# Patient Record
Sex: Male | Born: 1990
Health system: Southern US, Community
[De-identification: ages and names within clinical notes are randomized; demographics above are authoritative.]

## PROBLEM LIST (undated history)

## (undated) DIAGNOSIS — J189 Pneumonia, unspecified organism: Secondary | ICD-10-CM

## (undated) DIAGNOSIS — R011 Cardiac murmur, unspecified: Secondary | ICD-10-CM

## (undated) DIAGNOSIS — K219 Gastro-esophageal reflux disease without esophagitis: Secondary | ICD-10-CM

## (undated) DIAGNOSIS — J40 Bronchitis, not specified as acute or chronic: Secondary | ICD-10-CM

---
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
## 2011-01-31 ENCOUNTER — Inpatient Hospital Stay (INDEPENDENT_AMBULATORY_CARE_PROVIDER_SITE_OTHER)
Admission: RE | Admit: 2011-01-31 | Discharge: 2011-01-31 | Disposition: A | Discharge status: Home / Self Care | Payer: No Typology Code available for payment source | Source: Ambulatory Visit | Attending: Emergency Medicine | Admitting: Emergency Medicine

## 2011-01-31 DIAGNOSIS — T148XXA Other injury of unspecified body region, initial encounter: Secondary | ICD-10-CM

## 2011-01-31 DIAGNOSIS — S139XXA Sprain of joints and ligaments of unspecified parts of neck, initial encounter: Secondary | ICD-10-CM

## 2011-03-17 ENCOUNTER — Emergency Department (HOSPITAL_BASED_OUTPATIENT_CLINIC_OR_DEPARTMENT_OTHER)
Admission: EM | Admit: 2011-03-17 | Discharge: 2011-03-18 | Disposition: A | Discharge status: Home / Self Care | Payer: Self-pay | Attending: Emergency Medicine | Admitting: Emergency Medicine

## 2011-03-17 ENCOUNTER — Emergency Department (INDEPENDENT_AMBULATORY_CARE_PROVIDER_SITE_OTHER): Payer: Self-pay

## 2011-03-17 DIAGNOSIS — M25519 Pain in unspecified shoulder: Secondary | ICD-10-CM

## 2011-03-17 DIAGNOSIS — Y9383 Activity, rough housing and horseplay: Secondary | ICD-10-CM

## 2011-03-17 DIAGNOSIS — S43006A Unspecified dislocation of unspecified shoulder joint, initial encounter: Secondary | ICD-10-CM | POA: Insufficient documentation

## 2011-03-17 DIAGNOSIS — X500XXA Overexertion from strenuous movement or load, initial encounter: Secondary | ICD-10-CM

## 2011-03-17 DIAGNOSIS — J45909 Unspecified asthma, uncomplicated: Secondary | ICD-10-CM | POA: Insufficient documentation

## 2011-03-17 DIAGNOSIS — Y92009 Unspecified place in unspecified non-institutional (private) residence as the place of occurrence of the external cause: Secondary | ICD-10-CM | POA: Insufficient documentation

## 2011-03-17 DIAGNOSIS — X58XXXA Exposure to other specified factors, initial encounter: Secondary | ICD-10-CM | POA: Insufficient documentation

## 2011-03-20 LAB — GC/CHLAMYDIA PROBE AMP, GENITAL
Chlamydia, DNA Probe: POSITIVE — AB
GC Probe Amp, Genital: POSITIVE — AB

## 2011-04-01 ENCOUNTER — Emergency Department (HOSPITAL_COMMUNITY)
Admission: EM | Admit: 2011-04-01 | Discharge: 2011-04-01 | Disposition: A | Discharge status: Home / Self Care | Payer: Self-pay | Attending: Emergency Medicine | Admitting: Emergency Medicine

## 2011-04-01 DIAGNOSIS — L02219 Cutaneous abscess of trunk, unspecified: Secondary | ICD-10-CM | POA: Insufficient documentation

## 2011-04-04 ENCOUNTER — Emergency Department (HOSPITAL_COMMUNITY)
Admission: EM | Admit: 2011-04-04 | Discharge: 2011-04-04 | Disposition: A | Discharge status: Home / Self Care | Payer: Self-pay | Attending: Emergency Medicine | Admitting: Emergency Medicine

## 2011-04-04 DIAGNOSIS — IMO0002 Reserved for concepts with insufficient information to code with codable children: Secondary | ICD-10-CM | POA: Insufficient documentation

## 2011-07-26 ENCOUNTER — Encounter: Payer: Self-pay | Admitting: *Deleted

## 2011-07-26 ENCOUNTER — Emergency Department (HOSPITAL_BASED_OUTPATIENT_CLINIC_OR_DEPARTMENT_OTHER)
Admission: EM | Admit: 2011-07-26 | Discharge: 2011-07-26 | Disposition: A | Discharge status: Home / Self Care | Payer: Self-pay | Attending: Emergency Medicine | Admitting: Emergency Medicine

## 2011-07-26 DIAGNOSIS — F17200 Nicotine dependence, unspecified, uncomplicated: Secondary | ICD-10-CM | POA: Insufficient documentation

## 2011-07-26 DIAGNOSIS — J45909 Unspecified asthma, uncomplicated: Secondary | ICD-10-CM | POA: Insufficient documentation

## 2011-07-26 DIAGNOSIS — K0889 Other specified disorders of teeth and supporting structures: Secondary | ICD-10-CM

## 2011-07-26 DIAGNOSIS — K089 Disorder of teeth and supporting structures, unspecified: Secondary | ICD-10-CM | POA: Insufficient documentation

## 2011-07-26 MED ORDER — HYDROCODONE-ACETAMINOPHEN 5-325 MG PO TABS: 1.0000 | ORAL_TABLET | ORAL | Status: AC | PRN

## 2011-07-26 MED ORDER — OXYCODONE-ACETAMINOPHEN 5-325 MG PO TABS
1.0000 | ORAL_TABLET | Freq: Once | ORAL | Status: AC
  Administered 2011-07-26: 1 via ORAL

## 2011-07-26 MED ORDER — IBUPROFEN 400 MG PO TABS
600.0000 mg | ORAL_TABLET | Freq: Once | ORAL | Status: AC
  Administered 2011-07-26: 800 mg via ORAL

## 2011-07-26 MED ORDER — CLINDAMYCIN HCL 150 MG PO CAPS: 150.0000 mg | ORAL_CAPSULE | Freq: Four times a day (QID) | ORAL | Status: AC

## 2011-07-26 MED ORDER — IBUPROFEN 600 MG PO TABS: 600.0000 mg | ORAL_TABLET | Freq: Four times a day (QID) | ORAL | Status: AC | PRN

## 2011-07-26 NOTE — ED Notes (Signed)
Pt c/o left jaw pain and toothache x 1 day

## 2011-07-26 NOTE — ED Provider Notes (Signed)
History     CSN: [id number] Arrival date & time: 07/26/2011  4:47 PM  Chief Complaint  Patient presents with  . Dental Pain   Patient is a 20 y.o. male presenting with tooth pain. The history is provided by the patient.  Dental PainThe primary symptoms include mouth pain. Primary symptoms do not include dental injury, oral bleeding, fever or shortness of breath. Episode onset: today. The symptoms are unchanged. The symptoms are new. The symptoms occur constantly.  Additional symptoms include: dental sensitivity to temperature. Additional symptoms do not include: gum swelling, gum tenderness, trismus, facial swelling, pain with swallowing and drooling.    Past Medical History  Diagnosis Date  . Asthma     History reviewed. No pertinent past surgical history.  History reviewed. No pertinent family history.  History  Substance Use Topics  . Smoking status: Current Everyday Smoker -- 0.5 packs/day  . Smokeless tobacco: Not on file  . Alcohol Use: No      Review of Systems  Constitutional: Negative for fever.  HENT: Negative for facial swelling and drooling.   Respiratory: Negative for shortness of breath.   All other systems reviewed and are negative.    Physical Exam  BP 131/79  Temp(Src) 98.5 F (36.9 C) (Oral)  Resp 16  Wt 165 lb (74.844 kg)  Physical Exam  Nursing note and vitals reviewed. Constitutional: He is oriented to person, place, and time. He appears well-developed and well-nourished.  HENT:  Head: Normocephalic.       Dental carrie with tenderness and no gingival swelling of the left lower 2nd molar. Impacted left lower wisdom tooth  Eyes: EOM are normal.  Neck: Normal range of motion.  Pulmonary/Chest: Effort normal.  Musculoskeletal: Normal range of motion.  Neurological: He is alert and oriented to person, place, and time.  Psychiatric: He has a normal mood and affect.    ED Course  Procedures  MDM Dental Pain. Home with antibiotics and pain  medicine. Recommend dental follow up. No signs of gingival abscess. Tolerating secretions. Airway patent. No sub lingular swelling       Lyanne Co, MD 07/26/11 [phone]

## 2012-04-04 ENCOUNTER — Encounter (HOSPITAL_COMMUNITY): Payer: Self-pay | Admitting: *Deleted

## 2012-04-04 ENCOUNTER — Emergency Department (INDEPENDENT_AMBULATORY_CARE_PROVIDER_SITE_OTHER)
Admission: EM | Admit: 2012-04-04 | Discharge: 2012-04-04 | Disposition: A | Discharge status: Home / Self Care | Payer: Self-pay | Source: Home / Self Care | Attending: Emergency Medicine | Admitting: Emergency Medicine

## 2012-04-04 DIAGNOSIS — L739 Follicular disorder, unspecified: Secondary | ICD-10-CM

## 2012-04-04 DIAGNOSIS — L299 Pruritus, unspecified: Secondary | ICD-10-CM

## 2012-04-04 DIAGNOSIS — L738 Other specified follicular disorders: Secondary | ICD-10-CM

## 2012-04-04 LAB — HIV ANTIBODY (ROUTINE TESTING W REFLEX): HIV: NONREACTIVE

## 2012-04-04 LAB — RPR: RPR Ser Ql: NONREACTIVE

## 2012-04-04 MED ORDER — DOXYCYCLINE HYCLATE 100 MG PO CAPS: 100.0000 mg | ORAL_CAPSULE | Freq: Two times a day (BID) | ORAL | Status: AC

## 2012-04-04 MED ORDER — HYDROXYZINE HCL 25 MG PO TABS: 25.0000 mg | ORAL_TABLET | Freq: Four times a day (QID) | ORAL | Status: AC

## 2012-04-04 NOTE — ED Notes (Signed)
Pt  Has  2  Complaints     He  Has  An itchy  Rash  On  abd  And  Both  Legs        X  1  Week      denys  Any new  meds         He  Appears  In  No  Acute  Distress           He  Is    Awake   And alert        And    Oriented    He  Displays   No  Angioedema     He  Is  Speaking in  Complete  sentances          He  Also  Reports  Weight  Gain  Recently  With  No     Apparent reason he  States

## 2012-04-04 NOTE — Discharge Instructions (Signed)
Folliculitis  Folliculitis is an infection and inflammation of the hair follicles. Hair follicles become red and irritated. This inflammation is usually caused by bacteria. The bacteria thrive in warm, moist environments. This condition can be seen anywhere on the body.  CAUSES The most common cause of folliculitis is an infection by germs (bacteria). Fungal and viral infections can also cause the condition. Viral infections may be more common in people whose bodies are unable to fight disease well (weakened immune systems). Examples include people with:  AIDS.   An organ transplant.   Cancer.  People with depressed immune systems, diabetes, or obesity, have a greater risk of getting folliculitis than the general population. Certain chemicals, especially oils and tars, also can cause folliculitis. SYMPTOMS  An early sign of folliculitis is a small, white or yellow pus-filled, itchy lesion (pustule). These lesions appear on a red, inflamed follicle. They are usually less than 5 mm (.20 inches).   The most likely starting points are the scalp, thighs, legs, back and buttocks. Folliculitis is also frequently found in areas of repeated shaving.   When an infection of the follicle goes deeper, it becomes a boil or furuncle. A group of closely packed boils create a larger lesion (a carbuncle). These sores (lesions) tend to occur in hairy, sweaty areas of the body.  TREATMENT   A doctor who specializes in skin problems (dermatologists) treats mild cases of folliculitis with antiseptic washes.   They also use a skin application which kills germs (topical antibiotics). Tea tree oil is a good topical antiseptic as well. It can be found at a health food store. A small percentage of individuals may develop an allergy to the tea tree oil.   Mild to moderate boils respond well to warm water compresses applied three times daily.   In some cases, oral antibiotics should be taken with the skin treatment.     If lesions contain large quantities of pus or fluid, your caregiver may drain them. This allows the topical antibiotics to get to the affected areas better.   Stubborn cases of folliculitis may respond to laser hair removal. This process uses a high intensity light beam (a laser) to destroy the follicle and reduces the scarring from folliculitis. After laser hair removal, hair will no longer grow in the laser treated area.  Patients with long-lasting folliculitis need to find out where the infection is coming from. Germs can live in the nostrils of the patient. This can trigger an outbreak now and then. Sometimes the bacteria live in the nostrils of a family member. This person does not develop the disorder but they repeatedly re-expose others to the germ. To break the cycle of recurrence in the patient, the family member must also undergo treatment. PREVENTION   Individuals who are predisposed to folliculitis should be extremely careful about personal hygiene.   Application of antiseptic washes may help prevent recurrences.   A topical antibiotic cream, mupirocin (Bactroban), has been effective at reducing bacteria in the nostrils. It is applied inside the nose with your little finger. This is done twice daily for a week. Then it is repeated every 6 months.   Because follicle disorders tend to come back, patients must receive follow-up care. Your caregiver may be able to recognize a recurrence before it becomes severe.  SEEK IMMEDIATE MEDICAL CARE IF:   You develop redness, swelling, or increasing pain in the area.   You have a fever.   You are not improving with treatment  or are getting worse.   You have any other questions or concerns.  Document Released: 01/29/2002 Document Revised: 11/09/2011 Document Reviewed: 11/25/2008 [facility] Patient Information 2012 [place], [place].Pruritus  Pruritis is an itch. There are many different problems that can cause an itch. Dry skin is one of the  most common causes of itching. Most cases of itching do not require medical attention.  HOME CARE INSTRUCTIONS  Make sure your skin is moistened on a regular basis. A moisturizer that contains petroleum jelly is best for keeping moisture in your skin. If you develop a rash, you may try the following for relief:   Use corticosteroid cream.   Apply cool compresses to the affected areas.   Bathe with Epsom salts or baking soda in the bathwater.   Soak in colloidal oatmeal baths. These are available at your pharmacy.   Apply baking soda paste to the rash. Stir water into baking soda until it reaches a paste-like consistency.   Use an anti-itch lotion.   Take over-the-counter diphenhydramine medicine by mouth as the instructions direct.   Avoid scratching. Scratching may cause the rash to become infected. If itching is very bad, your caregiver may suggest prescription lotions or creams to lessen your symptoms.   Avoid hot showers, which can make itching worse. A cold shower may help with itching as long as you use a moisturizer after the shower.  SEEK MEDICAL CARE IF: The itching does not go away after several days. Document Released: 08/02/2011 Document Revised: 11/09/2011 Document Reviewed: 08/02/2011 [facility] Patient Information 2012 [place], [place].Pruritus  Pruritis is an itch. There are many different problems that can cause an itch. Dry skin is one of the most common causes of itching. Most cases of itching do not require medical attention.  HOME CARE INSTRUCTIONS  Make sure your skin is moistened on a regular basis. A moisturizer that contains petroleum jelly is best for keeping moisture in your skin. If you develop a rash, you may try the following for relief:   Use corticosteroid cream.   Apply cool compresses to the affected areas.   Bathe with Epsom salts or baking soda in the bathwater.   Soak in colloidal oatmeal baths. These are available at your pharmacy.   Apply  baking soda paste to the rash. Stir water into baking soda until it reaches a paste-like consistency.   Use an anti-itch lotion.   Take over-the-counter diphenhydramine medicine by mouth as the instructions direct.   Avoid scratching. Scratching may cause the rash to become infected. If itching is very bad, your caregiver may suggest prescription lotions or creams to lessen your symptoms.   Avoid hot showers, which can make itching worse. A cold shower may help with itching as long as you use a moisturizer after the shower.  SEEK MEDICAL CARE IF: The itching does not go away after several days. Document Released: 08/02/2011 Document Revised: 11/09/2011 Document Reviewed: 08/02/2011 [facility] Patient Information 2012 [place], [place].

## 2012-04-04 NOTE — ED Provider Notes (Signed)
History     CSN: [id number]  Arrival date & time 04/04/12  1120   First MD Initiated Contact with Patient 04/04/12 1310      Chief Complaint  Patient presents with  . Rash    (Consider location/radiation/quality/duration/timing/severity/associated sxs/prior treatment) HPI Comments: Patient presents urgent care to complaining of ongoing pruritic rash abdomen back and both legs more than a week has had this rash before. Also his pubic area. Denies fevers, malaise fatigue headaches. No penile discharge no facial swelling. Patient also as that he recently has gained some undetermined amount away.  Patient is a 21 y.o. male presenting with rash. The history is provided by the patient.  Rash  This is a new problem. The current episode started more than 1 week ago. The problem is associated with nothing. There has been no fever. The rash is present on the torso, back and trunk. The pain is mild. The pain has been fluctuating since onset. Associated symptoms include itching. Pertinent negatives include no pain and no weeping. He has tried OTC analgesics for the symptoms. The treatment provided no relief.    Past Medical History  Diagnosis Date  . Asthma     History reviewed. No pertinent past surgical history.  History reviewed. No pertinent family history.  History  Substance Use Topics  . Smoking status: Current Everyday Smoker -- 0.5 packs/day  . Smokeless tobacco: Not on file  . Alcohol Use: No      Review of Systems  Constitutional: Negative for fever, chills, appetite change, fatigue and unexpected weight change.  HENT: Positive for neck stiffness. Negative for neck pain.   Respiratory: Negative for cough.   Musculoskeletal: Negative for myalgias, joint swelling and arthralgias.  Skin: Positive for itching and rash. Negative for wound.    Allergies  Betadine and Penicillins  Home Medications   Current Outpatient Rx  Name Route Sig Dispense Refill  . DOXYCYCLINE  HYCLATE 100 MG PO CAPS Oral Take 1 capsule (100 mg total) by mouth 2 (two) times daily. 20 capsule 0  . HYDROXYZINE HCL 25 MG PO TABS Oral Take 1 tablet (25 mg total) by mouth every 6 (six) hours. 12 tablet 0    BP 114/74  Pulse 73  Temp(Src) 98 F (36.7 C) (Oral)  Resp 20  SpO2 98%  Physical Exam  Vitals reviewed. Constitutional: He appears well-developed and well-nourished.  Non-toxic appearance. He does not have a sickly appearance. He does not appear ill. No distress.  HENT:  Head: Normocephalic.  Eyes: Conjunctivae are normal.  Neurological: He is alert.  Skin: Skin is warm. Rash noted. No abrasion and no bruising noted. Rash is not papular and not nodular. There is erythema.       ED Course  Procedures (including critical care time)   Labs Reviewed  RPR  HIV ANTIBODY (ROUTINE TESTING)   No results found.   1. Folliculitis   2. Pruritus       MDM  Generalized folliculitis type rash with multiple previous lesions in hyperpigmented areas. Predominantly on his torso back and in her aspect of legs. Did not find any papular eruptions on hands periumbilical area or axillary area to suspect scabies. As this rash patient describes as pruritic in character. Have sided to screen patient secondary syphilis patient described concerns about potential STD exposures.        Jimmie Molly, MD 04/04/12 1401

## 2012-07-13 ENCOUNTER — Emergency Department (HOSPITAL_COMMUNITY)
Admission: EM | Admit: 2012-07-13 | Discharge: 2012-07-13 | Disposition: A | Discharge status: Home / Self Care | Payer: Self-pay | Attending: Emergency Medicine | Admitting: Emergency Medicine

## 2012-07-13 ENCOUNTER — Encounter (HOSPITAL_COMMUNITY): Payer: Self-pay | Admitting: *Deleted

## 2012-07-13 ENCOUNTER — Emergency Department (HOSPITAL_COMMUNITY): Payer: Self-pay

## 2012-07-13 DIAGNOSIS — R05 Cough: Secondary | ICD-10-CM

## 2012-07-13 DIAGNOSIS — J45909 Unspecified asthma, uncomplicated: Secondary | ICD-10-CM | POA: Insufficient documentation

## 2012-07-13 DIAGNOSIS — R079 Chest pain, unspecified: Secondary | ICD-10-CM | POA: Insufficient documentation

## 2012-07-13 DIAGNOSIS — R091 Pleurisy: Secondary | ICD-10-CM | POA: Insufficient documentation

## 2012-07-13 HISTORY — DX: Pneumonia, unspecified organism: J18.9

## 2012-07-13 HISTORY — DX: Bronchitis, not specified as acute or chronic: J40

## 2012-07-13 LAB — COMPREHENSIVE METABOLIC PANEL
ALT: 29 U/L (ref 0–53)
AST: 39 U/L — ABNORMAL HIGH (ref 0–37)
Albumin: 3.9 g/dL (ref 3.5–5.2)
Alkaline Phosphatase: 86 U/L (ref 39–117)
BUN: 9 mg/dL (ref 6–23)
CO2: 25 mEq/L (ref 19–32)
Calcium: 8.7 mg/dL (ref 8.4–10.5)
Chloride: 105 mEq/L (ref 96–112)
Creatinine, Ser: 0.91 mg/dL (ref 0.50–1.35)
GFR calc Af Amer: >90 mL/min (ref >90)
GFR calc non Af Amer: >90 mL/min (ref >90)
Glucose, Bld: 108 mg/dL — ABNORMAL HIGH (ref 70–99)
Potassium: 3.2 mEq/L — ABNORMAL LOW (ref 3.5–5.1)
Sodium: 140 mEq/L (ref 135–145)
Total Bilirubin: 0.5 mg/dL (ref 0.3–1.2)
Total Protein: 6.8 g/dL (ref 6.0–8.3)

## 2012-07-13 LAB — CBC WITH DIFFERENTIAL/PLATELET
Basophils Absolute: 0.1 10*3/uL (ref 0.0–0.1)
Basophils Relative: 0 % (ref 0–1)
Eosinophils Absolute: 0.5 10*3/uL (ref 0.0–0.7)
Eosinophils Relative: 4 % (ref 0–5)
HCT: 46.8 % (ref 39.0–52.0)
Hemoglobin: 16.1 g/dL (ref 13.0–17.0)
Lymphocytes Relative: 32 % (ref 12–46)
Lymphs Abs: 4.2 10*3/uL — ABNORMAL HIGH (ref 0.7–4.0)
MCH: 31 pg (ref 26.0–34.0)
MCHC: 34.4 g/dL (ref 30.0–36.0)
MCV: 90.2 fL (ref 78.0–100.0)
Monocytes Absolute: 1.2 10*3/uL — ABNORMAL HIGH (ref 0.1–1.0)
Monocytes Relative: 9 % (ref 3–12)
Neutro Abs: 7.3 10*3/uL (ref 1.7–7.7)
Neutrophils Relative %: 55 % (ref 43–77)
Platelets: 194 10*3/uL (ref 150–400)
RBC: 5.19 MIL/uL (ref 4.22–5.81)
RDW: 12.9 % (ref 11.5–15.5)
WBC: 13.2 10*3/uL — ABNORMAL HIGH (ref 4.0–10.5)

## 2012-07-13 LAB — D-DIMER, QUANTITATIVE: D-Dimer, Quant: <0.22 ug/mL-FEU (ref 0.00–0.48)

## 2012-07-13 LAB — TROPONIN I: Troponin I: <0.3 ng/mL (ref <0.30)

## 2012-07-13 MED ORDER — OXYCODONE-ACETAMINOPHEN 5-325 MG PO TABS: 1.0000 | ORAL_TABLET | Freq: Four times a day (QID) | ORAL | Status: AC | PRN

## 2012-07-13 MED ORDER — IPRATROPIUM BROMIDE 0.02 % IN SOLN
0.5000 mg | RESPIRATORY_TRACT | Status: DC
  Administered 2012-07-13: 0.5 mg via RESPIRATORY_TRACT
  Filled 2012-07-13: qty 2.5

## 2012-07-13 MED ORDER — ALBUTEROL SULFATE (5 MG/ML) 0.5% IN NEBU
2.5000 mg | INHALATION_SOLUTION | RESPIRATORY_TRACT | Status: DC
  Administered 2012-07-13: 2.5 mg via RESPIRATORY_TRACT
  Filled 2012-07-13: qty 0.5

## 2012-07-13 MED ORDER — PREDNISONE 20 MG PO TABS: ORAL_TABLET | ORAL | Status: AC

## 2012-07-13 MED ORDER — ALBUTEROL SULFATE HFA 108 (90 BASE) MCG/ACT IN AERS: 1.0000 | INHALATION_SPRAY | Freq: Four times a day (QID) | RESPIRATORY_TRACT | Status: DC | PRN

## 2012-07-13 MED ORDER — HYDROMORPHONE HCL PF 1 MG/ML IJ SOLN
1.0000 mg | Freq: Once | INTRAMUSCULAR | Status: AC
  Administered 2012-07-13: 1 mg via INTRAVENOUS
  Filled 2012-07-13: qty 1

## 2012-07-13 NOTE — Discharge Instructions (Signed)
Take prednisone as prescribed. Use inhaler as needed. Take percocet as needed for your chest pain. No driving or operating heavy machinery while taking percocet. This medication may cause drowsiness. Return to the ED if your symptoms worsen. Return if you become short of breath, develop chest pain, or have any trouble breathing. Follow up with one of the resources below for asthma management.  RESOURCE GUIDE  Chronic Pain Problems: Contact [facility]  [phone] Patients need to be referred by their primary care doctor.  Insufficient Money for Medicine: Contact United Way:  call "211" or [facility] [phone].  No Primary Care Doctor: - Call [facility]  [phone] - can help you locate a primary care doctor that  accepts your insurance, provides certain services, etc. - Physician Referral Service- [phone]  Agencies that provide inexpensive medical care: - [facility]  [phone] - [facility]  [phone] - [facility]  [phone] - Women's Clinic  [phone] - Planned Parenthood  [phone] [facility]  [phone]  Medicaid-accepting [facility] Providers: - [facility]- [address]  [phone], Mon-Fri 9am-7pm, Sat 9am-1pm - [facility]- [address] [place], [address] [place]  [phone] - [facility]- [address] [place]  [phone] [facility]- [address]  [phone] - Renaye Rakers- [address] [place], [address], [phone]  Only accepts [place] patients after they have their name  applied to their card  Self Pay (no insurance) in [place]: - Sickle Cell Patients: Dr Willey Blade, [facility]  [address] [place], [phone] - [facility]- [address] [place]  [phone]       [facility] [place]- [address]       -     [facility]- see information above (Speak to [employer] if you do not have insurance)       -  [facility]- [address] [place], [phone]       -  [facility]- [address],  [phone]       -  [facility]- [address], [phone]       -  Dr Julio Sicks-  [address], [place], [phone]       -  [facility]- [address] [place] [address] [phone]       -  [facility]- [address] [phone], also [address] [phone]       -    [facility]- [address] [place], [phone], 1st & 3rd Saturday   every month, 10am-1pm  1) Find a Doctor and Pay Out of Pocket Although you won't have to find out who is covered by your insurance plan, it is a good idea to ask around and get recommendations. You will then need to call the office and see if the doctor you have chosen will accept you as a new patient and what types of options they offer for patients who are self-pay. Some doctors offer discounts or will set up payment plans for their patients who do not have insurance, but you will need to ask so you aren't surprised when you get to your appointment.  2) Contact Your Local Health  Department Not all health departments have doctors that can see patients for sick visits, but many do, so it is worth a call to see if yours does. If you don't know where your local health department is, you can check in your phone book. The CDC also has a tool to help you locate your state's health department, and many state websites also have listings of all of their local health departments.  3) Find a Walk-in Clinic If your illness is not likely to be very severe or complicated, you may want to try a walk in clinic. These are popping up all over the country in pharmacies, drugstores, and shopping centers. They're usually staffed by nurse practitioners or physician assistants that have been trained to treat common illnesses  and complaints. They're usually fairly quick and inexpensive. However, if you have serious medical issues or chronic medical problems, these are probably not your best option  STD Testing - [facility] Department of [facility], STD Clinic, [address] [place], phone [phone] or [phone].  Monday - Friday, call for an appointment. [facility] Department of [employer], STD Clinic, [address], [place], phone [phone] or [phone].  Monday - Friday, call for an appointment.  Abuse/Neglect: [facility] Child Abuse Hotline 205-796-6365 [facility] Child Abuse Hotline 425-204-5425 (After Hours)  Emergency Shelter:  [facility] (438)154-0121  Maternity Homes: - Room at the [place] [facility] 786-739-8712 - [facility] [phone]  MRSA Hotline #:   947-462-7827  [facility] Resources  [facility] [place]  [employer] [facility] [address]                 [address]         [address] [place] [address]  Blondell Reveal Phone:  [phone]                                  Phone:  [phone]                   Phone:  [phone]  [facility], [phone] - [facility] - [phone]       -     [facility] in [place], [address]                                  [phone], [facility] Child Abuse Hotline 959-739-8734 or 480-133-5797 (After Hours)   Behavioral Health Services  Substance Abuse Resources: - [facility] Drug [facility]  825-136-6392 - [facility] ([phone] - [facility] [place] [phone] Floydene Flock [phone] - Residential & Outpatient Substance Abuse Program  [phone]  Psychological  Services: [facility]  [phone] [facility]  [phone] -  [facility], [place] [address], [place], ACCESS LINE: 308-555-0889 or (640) [phone], [employer]  Dental Assistance  If unable to pay or uninsured, contact:  [facility] or [facility]. to become qualified for the adult dental clinic.  Patients with Medicaid: [facility] [phone] [address], [phone] [address] [phone]  If unable to pay, or uninsured, contact [facility] [phone]) [phone]) or [facility] Department [phone] in [place], [phone] in [facility]) to become qualified for the adult dental clinic  Other Low-Cost Community Dental Services: - [facility]- [address] [place], [place], [place], [address], [phone], [address], 2nd and 4th Thursday of the month at 6:30am.  10 clients each day by appointment, can sometimes see walk-in patients if someone does not show for an appointment. [facility]- [address] [place], [place], [address], [phone] - [facility]- [address] [place], [place], [address], [phone] - [facility]- [phone] [facility]- [phone] [facility] Department- [phone]

## 2012-07-13 NOTE — ED Notes (Addendum)
Pt states he has had a cough productive of clear sputum x1 week.  Pt states cough is worse when he lays down.  Pt denies fever and nausea, but states he sometimes coughs so violently that he wretches.  Pt's oropharynx is red, but no pustules are noted.  Pt does have some occasional inspiratory and expiratory wheezing in all lobes, but otherwise lung sounds are clear.  Pt has hx of asthma.

## 2012-07-13 NOTE — ED Provider Notes (Signed)
History     CSN: [id number]  Arrival date & time 07/13/12  1232   First MD Initiated Contact with Patient 07/13/12 1305      Chief Complaint  Patient presents with  . Cough  . Sore Throat    (Consider location/radiation/quality/duration/timing/severity/associated sxs/prior treatment) HPI Comments: 21 y/o male with a history of asthma presents with cough x 1 week. States the cough is generally non productive but he does occasionally cough up clear sputum. Cough is worse early in the morning and late at night. Worse when he is laying down. He does not take anything for asthma. He tried using his girlfriends albuterol inhaler with only temporary relief. Admits to slight SOB on exertion. Has a sore throat. Denies any chest pain or tightness, fever, chills, nausea, vomiting. States he quit smoking 1 week ago.  Patient is a 20 y.o. male presenting with cough and pharyngitis. The history is provided by the patient.  Cough Associated symptoms include sore throat and shortness of breath. Pertinent negatives include no chest pain and no chills.  Sore Throat Associated symptoms include coughing and a sore throat. Pertinent negatives include no chest pain, chills, fever, nausea or vomiting.    Past Medical History  Diagnosis Date  . Asthma   . Bronchitis   . Pneumonia     History reviewed. No pertinent past surgical history.  No family history on file.  History  Substance Use Topics  . Smoking status: Current Everyday Smoker -- 0.5 packs/day  . Smokeless tobacco: Not on file  . Alcohol Use: No      Review of Systems  Constitutional: Negative for fever and chills.  HENT: Positive for sore throat.   Respiratory: Positive for cough and shortness of breath. Negative for chest tightness.   Cardiovascular: Negative for chest pain.  Gastrointestinal: Negative for nausea and vomiting.    Allergies  Penicillins and Betadine  Home Medications  No current outpatient prescriptions on  file.  BP 155/86  Pulse 82  Temp 98.8 F (37.1 C) (Oral)  Resp 18  SpO2 98%  Physical Exam  Constitutional: He is oriented to person, place, and time. He appears well-developed and well-nourished. No distress.  HENT:  Head: Normocephalic and atraumatic.  Mouth/Throat: Mucous membranes are normal. Posterior oropharyngeal erythema present. No oropharyngeal exudate or posterior oropharyngeal edema.  Eyes: Conjunctivae are normal.  Neck: Neck supple.  Cardiovascular: Normal rate, regular rhythm and normal heart sounds.   Pulmonary/Chest: Effort normal. No stridor. He has wheezes in the right upper field, the right middle field, the right lower field, the left upper field, the left middle field and the left lower field. He has no rhonchi. He has no rales.       Deep harsh cough present  Neurological: He is alert and oriented to person, place, and time.  Skin: Skin is warm and dry. He is not diaphoretic.  Psychiatric: He has a normal mood and affect. His behavior is normal.    ED Course  Procedures (including critical care time)  Labs Reviewed - No data to display Dg Chest 2 View  07/13/2012  *RADIOLOGY REPORT*  Clinical Data: History of asthma, now with cough  CHEST - 2 VIEW  Comparison: 08/07/2007; 03/11/2007; 02/24/2007  Findings: Grossly unchanged cardiac silhouette and mediastinal contours.  There is mild thickening of the pulmonary interstitium without focal airspace opacity.  No pleural effusion or pneumothorax.  No acute osseous abnormalities.  IMPRESSION: Overall findings compatible with airways disease.  No focal  airspace opacities to suggest pneumonia.  Original Report Authenticated By: Waynard Reeds, M.D.    Patient became tachycardic, sob, and developed extreme chest pain during albuterol treatment. Patient also assessed by Dr. Anitra Lauth at that time. Patient moved from fast  Track to acute side of ED for pain control and labs. 3:13 PM Patient resting comfortably in bed.  Slightly elevated white count. CXR with no acute findings. Tachycardia resolved.  1. Cough   2. Asthma   3. Chest pain   4. Pleurisy       MDM  21 y/o male with asthma presenting with cough. While receiving albuterol breathing treatment be became tachycardic with chest pain and sob. After being moved to the acute side of ED and given dilaudid, he is resting comfortably in bed. CXR not showing any acute findings. Awaiting labs. Possible CTA pending lab results.  3:38 PM D-dimer negative. No CT will be obtained due to low risk for PE. Awaiting troponin. If negative, will discharge with prednisone taper, inhaler, and percocet for pain control. Discussed return precautions in detail with patient. Case discussed with Dr. Anitra Lauth who agrees with plan of care. 3:50 PM Troponin negative. Patient stable for discharge.     Trevor Mace, PA-C 07/13/12 1550

## 2012-07-13 NOTE — ED Provider Notes (Signed)
History     CSN: [id number]  Arrival date & time 07/13/12  1232   First MD Initiated Contact with Patient 07/13/12 1305      Chief Complaint  Patient presents with  . Cough  . Sore Throat    (Consider location/radiation/quality/duration/timing/severity/associated sxs/prior treatment) HPI  Past Medical History  Diagnosis Date  . Asthma   . Bronchitis   . Pneumonia     History reviewed. No pertinent past surgical history.  No family history on file.  History  Substance Use Topics  . Smoking status: Current Everyday Smoker -- 0.5 packs/day  . Smokeless tobacco: Not on file  . Alcohol Use: No      Review of Systems  Allergies  Penicillins and Betadine  Home Medications  No current outpatient prescriptions on file.  BP 153/72  Pulse 86  Temp 98.8 F (37.1 C) (Oral)  Resp 22  SpO2 100%  Physical Exam  ED Course  Procedures (including critical care time)  Labs Reviewed  CBC WITH DIFFERENTIAL - Abnormal; Notable for the following:    WBC 13.2 (*)     Lymphs Abs 4.2 (*)     Monocytes Absolute 1.2 (*)     All other components within normal limits  COMPREHENSIVE METABOLIC PANEL  TROPONIN I  D-DIMER, QUANTITATIVE   No results found.   Date: 07/13/2012  Rate: 106  Rhythm: sinus tachycardia  QRS Axis: normal  Intervals: normal  ST/T Wave abnormalities: nonspecific ST/T changes in interior and lateral leads  Conduction Disutrbances:none, signs of early repol  Narrative Interpretation:   Old EKG Reviewed: none available   No diagnosis found.    MDM   Pt seen in fast track with a chronic cough Maurice March has only used his girlfriend's inhaler intermittently. On exam today patient was initially wheezing and after albuterol treatment started developing severe pain in his chest. When I came to evaluate the patient he was extremely uncomfortable writhing in the bed grabbing his chest. He became tachycardic to the 110s. EKG is abnormal with T-wave inversion  inferiorly and laterally however there is no old to compare. Highly suggesting a pneumothorax is less likely. Concern for possible pneumomediastinum. Patient given pain control blood pressure remained stable, remained greater than 98%. CBC with a leukocytosis of 13,000, CMP, d-dimer, chest x-ray pending.  3:24 PM After pain meds pt feels much better.  CXR wnl.  D-dimer wnl.        Gwyneth Sprout, MD 07/13/12 1525

## 2012-07-13 NOTE — ED Notes (Signed)
RT called to start breathing treatment.

## 2012-07-14 NOTE — ED Provider Notes (Signed)
Medical screening examination/treatment/procedure(s) were conducted as a shared visit with non-physician practitioner(s) and myself.  I personally evaluated the patient during the encounter   Gwyneth Sprout, MD 07/14/12 [phone]

## 2012-07-15 ENCOUNTER — Emergency Department (HOSPITAL_COMMUNITY)
Admission: EM | Admit: 2012-07-15 | Discharge: 2012-07-15 | Disposition: A | Discharge status: Home / Self Care | Payer: Self-pay | Attending: Emergency Medicine | Admitting: Emergency Medicine

## 2012-07-15 ENCOUNTER — Emergency Department (HOSPITAL_COMMUNITY): Payer: Self-pay

## 2012-07-15 ENCOUNTER — Encounter (HOSPITAL_COMMUNITY): Payer: Self-pay | Admitting: Emergency Medicine

## 2012-07-15 DIAGNOSIS — R05 Cough: Secondary | ICD-10-CM | POA: Insufficient documentation

## 2012-07-15 DIAGNOSIS — R509 Fever, unspecified: Secondary | ICD-10-CM | POA: Insufficient documentation

## 2012-07-15 DIAGNOSIS — J45909 Unspecified asthma, uncomplicated: Secondary | ICD-10-CM | POA: Insufficient documentation

## 2012-07-15 DIAGNOSIS — R059 Cough, unspecified: Secondary | ICD-10-CM | POA: Insufficient documentation

## 2012-07-15 DIAGNOSIS — Z87891 Personal history of nicotine dependence: Secondary | ICD-10-CM | POA: Insufficient documentation

## 2012-07-15 DIAGNOSIS — J029 Acute pharyngitis, unspecified: Secondary | ICD-10-CM

## 2012-07-15 MED ORDER — DEXAMETHASONE SODIUM PHOSPHATE 10 MG/ML IJ SOLN
10.0000 mg | Freq: Once | INTRAMUSCULAR | Status: AC
  Administered 2012-07-15: 10 mg via INTRAMUSCULAR
  Filled 2012-07-15: qty 1

## 2012-07-15 MED ORDER — LIDOCAINE VISCOUS 2 % MT SOLN: 20.0000 mL | OROMUCOSAL | Status: AC | PRN

## 2012-07-15 MED ORDER — ALBUTEROL SULFATE (5 MG/ML) 0.5% IN NEBU
2.5000 mg | INHALATION_SOLUTION | Freq: Once | RESPIRATORY_TRACT | Status: AC
  Administered 2012-07-15: 2.5 mg via RESPIRATORY_TRACT
  Filled 2012-07-15: qty 0.5

## 2012-07-15 MED ORDER — AZITHROMYCIN 250 MG PO TABS: ORAL_TABLET | ORAL | Status: AC

## 2012-07-15 MED ORDER — IBUPROFEN 600 MG PO TABS: 600.0000 mg | ORAL_TABLET | Freq: Four times a day (QID) | ORAL | Status: AC | PRN

## 2012-07-15 NOTE — ED Notes (Signed)
Medication order read back and verified with Rhunette Croft MD.

## 2012-07-15 NOTE — Progress Notes (Signed)
Cm also discussed the importance of completing all antibiotics after pt stated he stop when "feels better"

## 2012-07-15 NOTE — Progress Notes (Signed)
[facility] ED CM spoke with pt and grandmother about [facility] annual indigent medication assistance program, importance of pcp, reviewed list of self pay pcp and comparison of cost for self pay providers. Reviewed medication assistance programs.  Grandmother stated pt "had asthma but it went away" CM discussed management of asthma and triggers versus "it went away" Pt states he stopped smoking Cm commended him for smoke cessation and reviewed asthma trigger prevention.  Empowered pt to manage his s/s.  Pt reports he was to get a dose of lidocaine for his throat Cm discussed this with ED RN.  Reviewed how to access and complete pt assistance programs through needymeds.com

## 2012-07-15 NOTE — ED Notes (Signed)
Pt placed in waiting room until prescriptions are ready.

## 2012-07-15 NOTE — ED Notes (Signed)
Contacted pharmacy about prescriptions. Pharmacy stated their computer was down, but will be filling it within 15-20 minutes.

## 2012-07-15 NOTE — ED Notes (Signed)
States that he has had a persistent cough for more than a week. States that he was seen Saturday and was given prednisone, hycodan, and albuterol which he got filled Saturday. States that he has pain in the right side of his chest.

## 2012-07-15 NOTE — ED Notes (Signed)
Case management at bedside.

## 2012-07-15 NOTE — Progress Notes (Signed)
[facility] ED CM contacted by ED RN, Taqu[place] after attempting to d/c pt and he stating "I don't have any money for medicines" Pt listed without pcp and insurance coverage.  CM discussed and provided his grandmother with a list of self pay providers.  CM spoke with Toni Amend in [place] pharmacy to confirm pt is eligible for [facility] medication program.  Tubed Rx x 2 to [facility] pharmacy Rxs for xylocaine 2% solution and zithromax 250 mg po for processing with request for ED RN to be called when medications ready for pick up.

## 2012-07-15 NOTE — ED Provider Notes (Signed)
History     CSN: [id number]  Arrival date & time 07/15/12  1424   First MD Initiated Contact with Patient 07/15/12 1448      Chief Complaint  Patient presents with  . Cough  . Sore Throat  . Fever    (Consider location/radiation/quality/duration/timing/severity/associated sxs/prior treatment) HPI Comments: Pt comes in with cc of sorethorat and DIB. Pt has hx of asthma, and was seen in the ED just 2 days ago. Pt reports that he suddenly started having dib, and a choking sensation. No hx of allergies, mucosal swelling. Pt has mild wheezing, no cough. Pt has sore throat as well, severe. No fevers, no chills, able to cough, control secretions.   Patient is a 21 y.o. male presenting with cough, pharyngitis, and fever. The history is provided by the patient.  Cough Pertinent negatives include no chest pain, no shortness of breath and no wheezing.  Sore Throat Pertinent negatives include no chest pain, no abdominal pain and no shortness of breath.  Fever Primary symptoms of the febrile illness include fever. Primary symptoms do not include cough, wheezing, shortness of breath, abdominal pain or dysuria.    Past Medical History  Diagnosis Date  . Asthma   . Bronchitis   . Pneumonia     History reviewed. No pertinent past surgical history.  No family history on file.  History  Substance Use Topics  . Smoking status: Former Smoker -- 0.5 packs/day  . Smokeless tobacco: Not on file  . Alcohol Use: No      Review of Systems  Constitutional: Positive for fever. Negative for activity change and appetite change.  HENT: Positive for neck pain. Negative for neck stiffness.   Respiratory: Positive for choking and stridor. Negative for cough, shortness of breath and wheezing.   Cardiovascular: Negative for chest pain.  Gastrointestinal: Negative for abdominal pain.  Genitourinary: Negative for dysuria.    Allergies  Penicillins and Betadine  Home Medications   Current  Outpatient Rx  Name Route Sig Dispense Refill  . ALBUTEROL SULFATE HFA 108 (90 BASE) MCG/ACT IN AERS Inhalation Inhale 1-2 puffs into the lungs every 6 (six) hours as needed for wheezing. 1 Inhaler 0  . OXYCODONE-ACETAMINOPHEN 5-325 MG PO TABS Oral Take 1-2 tablets by mouth every 6 (six) hours as needed for pain. 10 tablet 0  . PREDNISONE 20 MG PO TABS  Take 3 tabs PO x 2 days followed by 2 tabs PO x 2 days followed by 1 tab PO x 2 days 12 tablet 0  . AZITHROMYCIN 250 MG PO TABS  Take 2 tabs on day 1, then 1 tab q daily day 2 through 4 6 each 0  . IBUPROFEN 600 MG PO TABS Oral Take 1 tablet (600 mg total) by mouth every 6 (six) hours as needed for pain. 30 tablet 0  . LIDOCAINE VISCOUS 2 % MT SOLN Oral Take 20 mLs by mouth as needed for pain. 100 mL 0    BP 134/82  Pulse 67  Temp 98.9 F (37.2 C) (Oral)  Resp 16  SpO2 97%  Physical Exam  Nursing note and vitals reviewed. Constitutional: He is oriented to person, place, and time. He appears well-developed.  HENT:  Head: Normocephalic and atraumatic.  Nose: Nose normal.  Mouth/Throat: No oropharyngeal exudate.       erythematous pharynx, no neck swelling, no tripodin, no drooling - voice is hoarse  Eyes: Conjunctivae and EOM are normal. Pupils are equal, round, and reactive to light.  Neck: Normal range of motion. Neck supple.  Cardiovascular: Normal rate and regular rhythm.   Pulmonary/Chest: Breath sounds normal. Stridor present. He is in respiratory distress. He has no wheezes.  Abdominal: Soft. Bowel sounds are normal. He exhibits no distension. There is no tenderness. There is no rebound and no guarding.  Lymphadenopathy:    He has no cervical adenopathy.  Neurological: He is alert and oriented to person, place, and time.  Skin: Skin is warm.    ED Course  Procedures (including critical care time)   Labs Reviewed  RAPID STREP SCREEN   Dg Neck Soft Tissue  07/15/2012  *RADIOLOGY REPORT*  Clinical Data: Shortness of  breath, cough and wheezing.  NECK SOFT TISSUES - 1+ VIEW  Comparison: None.  Findings: The airway is patent.  The epiglottis and aryepiglottic folds appear normal.  Prevertebral soft tissues are normal.  No focal bony abnormality is identified.  IMPRESSION: Negative exam.  Original Report Authenticated By: Bernadene Bell. Maricela Curet, M.D.   Dg Chest Port 1 View  07/15/2012  *RADIOLOGY REPORT*  Clinical Data: Shortness of breath, cough and wheezing.  PORTABLE CHEST - 1 VIEW  Comparison: PA and lateral chest 07/13/2012.  Findings: The lungs are clear.  Heart size is normal.  No pneumothorax or effusion.  No focal bony abnormality.  IMPRESSION: Negative chest.  Original Report Authenticated By: Bernadene Bell. D'ALESSIO, M.D.     1. Pharyngitis       MDM  Pt comes in with cc of sore thorat, choking sensation. Pt crying when i arrived. Oropharyngeal exam is not very impressive  - however there is some erythema. CENTOR score is 0. I will get a soft tissue neck, decadron and albuterol to be given immediately. If all workup is normal  -will d/c as pharyngitis.   Derwood Kaplan, MD 07/15/12 1851

## 2013-02-09 ENCOUNTER — Encounter (HOSPITAL_COMMUNITY): Payer: Self-pay | Admitting: Emergency Medicine

## 2013-02-09 ENCOUNTER — Emergency Department (HOSPITAL_COMMUNITY)
Admission: EM | Admit: 2013-02-09 | Discharge: 2013-02-09 | Disposition: A | Discharge status: Home / Self Care | Payer: Self-pay | Attending: Emergency Medicine | Admitting: Emergency Medicine

## 2013-02-09 DIAGNOSIS — J45909 Unspecified asthma, uncomplicated: Secondary | ICD-10-CM | POA: Insufficient documentation

## 2013-02-09 DIAGNOSIS — Z79899 Other long term (current) drug therapy: Secondary | ICD-10-CM | POA: Insufficient documentation

## 2013-02-09 DIAGNOSIS — Z8701 Personal history of pneumonia (recurrent): Secondary | ICD-10-CM | POA: Insufficient documentation

## 2013-02-09 DIAGNOSIS — L02219 Cutaneous abscess of trunk, unspecified: Secondary | ICD-10-CM | POA: Insufficient documentation

## 2013-02-09 DIAGNOSIS — L0291 Cutaneous abscess, unspecified: Secondary | ICD-10-CM

## 2013-02-09 DIAGNOSIS — Z87891 Personal history of nicotine dependence: Secondary | ICD-10-CM | POA: Insufficient documentation

## 2013-02-09 DIAGNOSIS — R21 Rash and other nonspecific skin eruption: Secondary | ICD-10-CM | POA: Insufficient documentation

## 2013-02-09 DIAGNOSIS — L03319 Cellulitis of trunk, unspecified: Secondary | ICD-10-CM | POA: Insufficient documentation

## 2013-02-09 MED ORDER — OXYCODONE-ACETAMINOPHEN 5-325 MG PO TABS: ORAL_TABLET | ORAL | Status: DC

## 2013-02-09 MED ORDER — OXYCODONE-ACETAMINOPHEN 5-325 MG PO TABS
2.0000 | ORAL_TABLET | Freq: Once | ORAL | Status: AC
  Administered 2013-02-09: 2 via ORAL
  Filled 2013-02-09: qty 2

## 2013-02-09 MED ORDER — LIDOCAINE-EPINEPHRINE 2 %-1:100000 IJ SOLN
20.0000 mL | Freq: Once | INTRAMUSCULAR | Status: DC
  Filled 2013-02-09: qty 1

## 2013-02-09 NOTE — ED Provider Notes (Signed)
History     CSN: [id number]  Arrival date & time 02/09/13  4098   First MD Initiated Contact with Patient 02/09/13 [phone]      Chief Complaint  Patient presents with  . Recurrent Skin Infections    (Consider location/radiation/quality/duration/timing/severity/associated sxs/prior treatment) HPI  Donald Fisher is a 22 y.o. male complaining of abscess to chest worsening over the course of one week. Patient denies fever, nausea vomiting, drainage from the area. He has had one similar episode in the past. He rates his pain as moderate and exacerbated by palpation. He's been taking over-the-counter pain medications with little relief.  Past Medical History  Diagnosis Date  . Asthma   . Bronchitis   . Pneumonia     History reviewed. No pertinent past surgical history.  History reviewed. No pertinent family history.  History  Substance Use Topics  . Smoking status: Former Smoker -- 0.50 packs/day  . Smokeless tobacco: Not on file  . Alcohol Use: No      Review of Systems  Constitutional: Negative for fever.  Respiratory: Negative for shortness of breath.   Cardiovascular: Negative for chest pain.  Gastrointestinal: Negative for nausea, vomiting, abdominal pain and diarrhea.  Skin: Positive for rash.  All other systems reviewed and are negative.    Allergies  Penicillins and Betadine  Home Medications   Current Outpatient Rx  Name  Route  Sig  Dispense  Refill  . albuterol (PROVENTIL HFA;VENTOLIN HFA) 108 (90 BASE) MCG/ACT inhaler   Inhalation   Inhale 1-2 puffs into the lungs every 6 (six) hours as needed for wheezing.   1 Inhaler   0     BP 138/69  Pulse 88  Temp(Src) 98 F (36.7 C) (Oral)  Resp 18  SpO2 100%  Physical Exam  Nursing note and vitals reviewed. Constitutional: He is oriented to person, place, and time. He appears well-developed and well-nourished. No distress.  HENT:  Head: Normocephalic.  Eyes: Conjunctivae and EOM are normal.    Cardiovascular: Normal rate.   Pulmonary/Chest: Effort normal. No stridor.    Musculoskeletal: Normal range of motion.  Neurological: He is alert and oriented to person, place, and time.  Skin:  1 cm moderately fluctuant abscess to chest with no surrounding cellulitis  Psychiatric: He has a normal mood and affect.    ED Course  Procedures (including critical care time)  INCISION AND DRAINAGE Performed by: Wynetta Emery Consent: Verbal consent obtained. Risks and benefits: risks, benefits and alternatives were discussed Type: abscess  Body area: Chest  Anesthesia: local infiltration  Incision was made with a scalpel.  Local anesthetic: lidocaine 2% with epinephrine  Anesthetic total: 5 ml  Complexity: complex Blunt dissection to break up loculations  Drainage: purulent  Drainage amount: Scant   Packing material: 1/4 in iodoform gauze  Patient tolerance: Patient tolerated the procedure well with no immediate complications.    Labs Reviewed - No data to display No results found.   1. Abscess       MDM   Lister L Cifelli is a 22 y.o. male will fluctuant abscess to right chest incision and drainage performed.   Filed Vitals:   02/09/13 0901 02/09/13 1105  BP: 138/69 116/70  Pulse: 88 83  Temp: 98 F (36.7 C)   TempSrc: Oral   Resp: 18 14  SpO2: 100% 98%     Pt verbalized understanding and agrees with care plan. Outpatient follow-up and return precautions given.    Discharge Medication List  as of 02/09/2013 10:56 AM    START taking these medications   Details  oxyCODONE-acetaminophen (PERCOCET/ROXICET) 5-325 MG per tablet 1 to 2 tabs PO q6hrs  PRN for pain, Print               Wynetta Emery, PA-C 02/09/13 1622

## 2013-02-09 NOTE — ED Notes (Signed)
Patient reports that he has had a boil to his chest x 1 week

## 2013-02-10 NOTE — ED Provider Notes (Signed)
Medical screening examination/treatment/procedure(s) were performed by non-physician practitioner and as supervising physician I was immediately available for consultation/collaboration.  Iman Reinertsen T Kaisha Wachob, MD 02/10/13 2324 

## 2013-05-14 ENCOUNTER — Telehealth (HOSPITAL_COMMUNITY): Payer: Self-pay | Admitting: Emergency Medicine

## 2013-05-14 ENCOUNTER — Emergency Department (HOSPITAL_COMMUNITY)
Admission: EM | Admit: 2013-05-14 | Discharge: 2013-05-14 | Disposition: A | Discharge status: Home / Self Care | Payer: Self-pay | Attending: Emergency Medicine | Admitting: Emergency Medicine

## 2013-05-14 DIAGNOSIS — Z8701 Personal history of pneumonia (recurrent): Secondary | ICD-10-CM | POA: Insufficient documentation

## 2013-05-14 DIAGNOSIS — F17200 Nicotine dependence, unspecified, uncomplicated: Secondary | ICD-10-CM | POA: Insufficient documentation

## 2013-05-14 DIAGNOSIS — Z880 Allergy status to penicillin: Secondary | ICD-10-CM | POA: Insufficient documentation

## 2013-05-14 DIAGNOSIS — J45909 Unspecified asthma, uncomplicated: Secondary | ICD-10-CM | POA: Insufficient documentation

## 2013-05-14 DIAGNOSIS — N61 Mastitis without abscess: Secondary | ICD-10-CM | POA: Insufficient documentation

## 2013-05-14 NOTE — ED Notes (Signed)
See epic downtime charting pt discharge at 03:43

## 2013-05-14 NOTE — ED Notes (Signed)
Pharmacy called Pt rcvd Rx for Bactrim telling pharmacist he is allergic to Sulfa drugs.  Current Clinical research associate not showing Sulfa allergy.  Chart printed from scanned documents due to downtime while pt was here.  Unable to find what scripts were written while pt here.  Will have ED provider review documents.

## 2013-05-15 NOTE — ED Provider Notes (Signed)
Medical screening examination/treatment/procedure(s) were performed by non-physician practitioner and as supervising physician I was immediately available for consultation/collaboration.   Naje Rice, MD 05/15/13 1602 

## 2013-05-15 NOTE — ED Provider Notes (Signed)
Pt written for Bactrim but tells pharmacist that he has a Sulfa Allergy. He was seen for abscess drainage on 05/14/2013. Rx changed to Keflex and sent back to flow manager to call into pharmacy.    Dorthula Matas, PA-C 05/15/13 [phone]

## 2013-05-16 ENCOUNTER — Telehealth (HOSPITAL_COMMUNITY): Payer: Self-pay | Admitting: *Deleted

## 2013-05-16 NOTE — ED Notes (Signed)
Keflex 500mg  tab, sig: take 1 tab PO QID x 7 days, #28, per Ellin Saba PA-C given to pharmacist at [facility] [phone].

## 2013-05-20 MED FILL — Oxycodone w/ Acetaminophen Tab 5-325 MG: ORAL | Qty: 2 | Status: AC

## 2013-11-18 ENCOUNTER — Emergency Department (HOSPITAL_COMMUNITY): Payer: Self-pay

## 2013-11-18 ENCOUNTER — Encounter (HOSPITAL_COMMUNITY): Payer: Self-pay | Admitting: Emergency Medicine

## 2013-11-18 ENCOUNTER — Emergency Department (HOSPITAL_COMMUNITY)
Admission: EM | Admit: 2013-11-18 | Discharge: 2013-11-18 | Disposition: A | Discharge status: Home / Self Care | Payer: Self-pay | Attending: Emergency Medicine | Admitting: Emergency Medicine

## 2013-11-18 DIAGNOSIS — B9789 Other viral agents as the cause of diseases classified elsewhere: Secondary | ICD-10-CM | POA: Insufficient documentation

## 2013-11-18 DIAGNOSIS — R05 Cough: Secondary | ICD-10-CM

## 2013-11-18 DIAGNOSIS — IMO0001 Reserved for inherently not codable concepts without codable children: Secondary | ICD-10-CM | POA: Insufficient documentation

## 2013-11-18 DIAGNOSIS — R509 Fever, unspecified: Secondary | ICD-10-CM | POA: Insufficient documentation

## 2013-11-18 DIAGNOSIS — Z880 Allergy status to penicillin: Secondary | ICD-10-CM | POA: Insufficient documentation

## 2013-11-18 DIAGNOSIS — R042 Hemoptysis: Secondary | ICD-10-CM | POA: Insufficient documentation

## 2013-11-18 DIAGNOSIS — Z87891 Personal history of nicotine dependence: Secondary | ICD-10-CM | POA: Insufficient documentation

## 2013-11-18 DIAGNOSIS — J45901 Unspecified asthma with (acute) exacerbation: Secondary | ICD-10-CM | POA: Insufficient documentation

## 2013-11-18 DIAGNOSIS — J3489 Other specified disorders of nose and nasal sinuses: Secondary | ICD-10-CM | POA: Insufficient documentation

## 2013-11-18 DIAGNOSIS — R059 Cough, unspecified: Secondary | ICD-10-CM

## 2013-11-18 DIAGNOSIS — IMO0002 Reserved for concepts with insufficient information to code with codable children: Secondary | ICD-10-CM | POA: Insufficient documentation

## 2013-11-18 DIAGNOSIS — Z8701 Personal history of pneumonia (recurrent): Secondary | ICD-10-CM | POA: Insufficient documentation

## 2013-11-18 DIAGNOSIS — J029 Acute pharyngitis, unspecified: Secondary | ICD-10-CM | POA: Insufficient documentation

## 2013-11-18 DIAGNOSIS — R1110 Vomiting, unspecified: Secondary | ICD-10-CM | POA: Insufficient documentation

## 2013-11-18 DIAGNOSIS — B349 Viral infection, unspecified: Secondary | ICD-10-CM

## 2013-11-18 DIAGNOSIS — R51 Headache: Secondary | ICD-10-CM | POA: Insufficient documentation

## 2013-11-18 DIAGNOSIS — R000 Tachycardia, unspecified: Secondary | ICD-10-CM | POA: Insufficient documentation

## 2013-11-18 LAB — BASIC METABOLIC PANEL
BUN: 14 mg/dL (ref 6–23)
CO2: 26 mEq/L (ref 19–32)
Chloride: 98 mEq/L (ref 96–112)
Creatinine, Ser: 1.07 mg/dL (ref 0.50–1.35)
GFR calc Af Amer: >90 mL/min (ref >90)
Glucose, Bld: 88 mg/dL (ref 70–99)
Potassium: 3.6 mEq/L (ref 3.5–5.1)

## 2013-11-18 LAB — CBC
HCT: 46.8 % (ref 39.0–52.0)
Hemoglobin: 16.7 g/dL (ref 13.0–17.0)
MCH: 32.2 pg (ref 26.0–34.0)
MCHC: 35.7 g/dL (ref 30.0–36.0)
MCV: 90.2 fL (ref 78.0–100.0)

## 2013-11-18 LAB — RAPID STREP SCREEN (MED CTR MEBANE ONLY): Streptococcus, Group A Screen (Direct): NEGATIVE

## 2013-11-18 MED ORDER — BENZONATATE 100 MG PO CAPS
100.0000 mg | ORAL_CAPSULE | Freq: Once | ORAL | Status: AC
  Administered 2013-11-18: 100 mg via ORAL
  Filled 2013-11-18: qty 1

## 2013-11-18 MED ORDER — PREDNISONE 20 MG PO TABS: 40.0000 mg | ORAL_TABLET | Freq: Every day | ORAL | Status: DC

## 2013-11-18 MED ORDER — HYDROCODONE-HOMATROPINE 5-1.5 MG/5ML PO SYRP
5.0000 mL | ORAL_SOLUTION | Freq: Once | ORAL | Status: AC
Start: 2013-11-18 — End: 2013-11-18
  Administered 2013-11-18: 5 mL via ORAL
  Filled 2013-11-18: qty 5

## 2013-11-18 MED ORDER — ONDANSETRON 4 MG PO TBDP
4.0000 mg | ORAL_TABLET | ORAL | Status: AC
  Administered 2013-11-18: 4 mg via ORAL
  Filled 2013-11-18: qty 1

## 2013-11-18 MED ORDER — ALBUTEROL SULFATE HFA 108 (90 BASE) MCG/ACT IN AERS
2.0000 | INHALATION_SPRAY | Freq: Once | RESPIRATORY_TRACT | Status: AC
  Administered 2013-11-18: 2 via RESPIRATORY_TRACT
  Filled 2013-11-18: qty 6.7

## 2013-11-18 MED ORDER — SODIUM CHLORIDE 0.9 % IV BOLUS (SEPSIS)
1000.0000 mL | Freq: Once | INTRAVENOUS | Status: AC
  Administered 2013-11-18: 1000 mL via INTRAVENOUS

## 2013-11-18 MED ORDER — IBUPROFEN 800 MG PO TABS
800.0000 mg | ORAL_TABLET | Freq: Once | ORAL | Status: AC
  Administered 2013-11-18: 800 mg via ORAL
  Filled 2013-11-18: qty 1

## 2013-11-18 MED ORDER — ACETAMINOPHEN 325 MG PO TABS
650.0000 mg | ORAL_TABLET | Freq: Once | ORAL | Status: AC
  Administered 2013-11-18: 650 mg via ORAL

## 2013-11-18 MED ORDER — HYDROCODONE-HOMATROPINE 5-1.5 MG/5ML PO SYRP: 5.0000 mL | ORAL_SOLUTION | Freq: Four times a day (QID) | ORAL | Status: DC | PRN

## 2013-11-18 NOTE — ED Provider Notes (Signed)
CSN: [id number]     Arrival date & time 11/18/13  1641 History   First MD Initiated Contact with Patient 11/18/13 2019     Chief Complaint  Patient presents with  . Cough  . Hemoptysis   (Consider location/radiation/quality/duration/timing/severity/associated sxs/prior Treatment) HPI Comments: Patient is a 22 y/o male with a hx of who presents today for cough x 3 days. Patient states that symptoms have been progressively worsening since onset. Cough productive of clear sputum; however patient did notice some phlegm streaked with a small amount of bright red blood today. He denies coughing up clots. Symptoms also associated with subjective fever, nasal congestion, rhinorrhea, headache, myalgias, posttussive emesis x 1, and intermittent shortness of breath. He has tried Nyquil for his symptoms without relief. He denies LOC, neck pain, chest pain, hematemesis, hematochezia, diarrhea, melena, dysuria, hematuria, numbness/tingling, and weakness.  Patient is a 22 y.o. male presenting with cough. The history is provided by the patient. No language interpreter was used.  Cough Associated symptoms: fever, rhinorrhea, shortness of breath and sore throat   Associated symptoms: no chest pain     Past Medical History  Diagnosis Date  . Asthma   . Bronchitis   . Pneumonia    History reviewed. No pertinent past surgical history. History reviewed. No pertinent family history. History  Substance Use Topics  . Smoking status: Former Smoker -- 0.50 packs/day  . Smokeless tobacco: Not on file  . Alcohol Use: No    Review of Systems  Constitutional: Positive for fever.  HENT: Positive for congestion, rhinorrhea and sore throat.   Respiratory: Positive for cough and shortness of breath.   Cardiovascular: Negative for chest pain.  Gastrointestinal: Positive for vomiting (posttussive). Negative for abdominal pain and diarrhea.  Genitourinary: Negative for dysuria and hematuria.  Musculoskeletal:  Negative for neck pain.  Neurological: Negative for syncope, weakness and numbness.  All other systems reviewed and are negative.    Allergies  Penicillins; Shellfish allergy; and Betadine  Home Medications   Current Outpatient Rx  Name  Route  Sig  Dispense  Refill  . HYDROcodone-homatropine (HYCODAN) 5-1.5 MG/5ML syrup   Oral   Take 5 mLs by mouth every 6 (six) hours as needed for cough.   120 mL   0   . predniSONE (DELTASONE) 20 MG tablet   Oral   Take 2 tablets (40 mg total) by mouth daily.   10 tablet   0    BP 128/72  Pulse 99  Temp(Src) 99.1 F (37.3 C) (Oral)  Resp 22  Ht 5\' 6"  (1.676 m)  Wt 223 lb 6.4 oz (101.334 kg)  BMI 36.08 kg/m2  SpO2 99%  Physical Exam  Nursing note and vitals reviewed. Constitutional: He is oriented to person, place, and time. He appears well-developed and well-nourished. No distress.  HENT:  Head: Normocephalic and atraumatic.  Right Ear: External ear normal.  Left Ear: External ear normal.  Nose: Right sinus exhibits no maxillary sinus tenderness and no frontal sinus tenderness. Left sinus exhibits frontal sinus tenderness. Left sinus exhibits no maxillary sinus tenderness.  Mouth/Throat: Uvula is midline and mucous membranes are normal. No oral lesions. No trismus in the jaw. Posterior oropharyngeal erythema present. No oropharyngeal exudate, posterior oropharyngeal edema or tonsillar abscesses.  Patient tolerating secretions without difficulty or drooling.  Eyes: Conjunctivae and EOM are normal. Pupils are equal, round, and reactive to light. No scleral icterus.  Neck: Normal range of motion. Neck supple.  Patient moves neck with ease.  No meningismus or nuchal rigidity.  Cardiovascular: Regular rhythm, normal heart sounds and intact distal pulses.  Tachycardia present.   Pulses:      Radial pulses are 2+ on the right side, and 2+ on the left side.  Pulmonary/Chest: Effort normal and breath sounds normal. No respiratory distress.  He has no wheezes. He has no rales.  Dry nonproductive cough No retractions or accessory muscle use; no acute respiratory distress.  Abdominal: Soft. There is no tenderness. There is no rebound and no guarding.  Musculoskeletal: Normal range of motion.  Neurological: He is alert and oriented to person, place, and time.  Skin: Skin is warm and dry. No rash noted. He is not diaphoretic. No erythema. No pallor.  Psychiatric: He has a normal mood and affect. His behavior is normal.    ED Course  Procedures (including critical care time) Labs Review Labs Reviewed  CBC - Abnormal; Notable for the following:    Platelets 148 (*)    All other components within normal limits  RAPID STREP SCREEN  CULTURE, GROUP A STREP  BASIC METABOLIC PANEL  CG4 I-STAT (LACTIC ACID)   Imaging Review Dg Chest 2 View  11/18/2013   CLINICAL DATA:  Cough and fever  EXAM: CHEST  2 VIEW  COMPARISON:  None.  FINDINGS: The heart size and mediastinal contours are within normal limits. Both lungs are clear. The visualized skeletal structures are unremarkable.  IMPRESSION: No active cardiopulmonary disease.   Electronically Signed   By: Alcide Clever M.D.   On: 11/18/2013 20:45    EKG Interpretation   None       MDM   1. Viral illness   2. Cough     Patient presents today for cough, URI symptoms, myalgias, and intermittent SOB x 3 days. He endorses 2 episodes of bright red streaked sputum production. Patient nontoxic appearing and hemodynamically stable. Lungs CTAB on physical exam without wheezing. No retractions or accessory muscle use appreciated. Patient with dry, nonproductive cough. No hemoptysis in ED. Labs without leukocytosis. H/H stable; no significant thrombocytopenia. Chemistries stable and lactate WNL. Rapid strep today is negative and CXR without evidence of PNA, PTX, pleural effusion, or other acute cardiopulmonary abnormality. Ibuprofen, tessalon, and IVF ordered for symptoms.  Doubt pulmonary  embolism as cause of patient's hemoptysis and intermittent SOB given associated fever and URI symptoms. Patient without tachypnea, dyspnea, or hypoxia while in ED today. He does not have a hx of PE and denies recent surgeries/hospitalizations. Tachycardia explained by by patient's fever which is responding to ibuprofen. Tachycardia also improving after IVF. Symptoms c/w viral illness and bronchitis. Influenza also a differential diagnosis; he did not receive his flu shot this year. Patient, however, out of the window for tx with Tamiflu. Will reevaluate and check pulse oximetry while ambulating. Anticipate d/c home with supportive therapies.  Patient ambulates in the ED without hypoxia. He is no longer febrile and heart rate down to 99bpm. Believe patient is stable and appropriate for d/c home with prednisone, albuterol inhaler, and Hycodan as needed for cough. Advised alternating tylenol/ibuprofen every 3 hours for fever control and myalgias. Return precautions discussed with the patient. He verbalizes comfort and understanding with this discharge plan with no unaddressed concerns.   Filed Vitals:   11/18/13 1655 11/18/13 2005 11/18/13 2139 11/18/13 2253  BP: 138/72 130/80  128/72  Pulse: 122 113 104 99  Temp: 100.6 F (38.1 C) 101.6 F (38.7 C) 101.4 F (38.6 C) 99.1 F (37.3 C)  TempSrc: Oral Oral    Resp: 18 18  22   Height: 5\' 6"  (1.676 m)     Weight: 223 lb 6.4 oz (101.334 kg)     SpO2: 96% 93% 99% 99%     Antony Madura, PA-C 11/18/13 2300

## 2013-11-18 NOTE — ED Notes (Signed)
While walking pulse ox stayed between 94%-97%

## 2013-11-18 NOTE — ED Notes (Signed)
Pt c/o coug, generalized body aches; pt sts coughed up some bloody sputum today and has had some fever

## 2013-11-19 NOTE — ED Provider Notes (Signed)
Medical screening examination/treatment/procedure(s) were performed by non-physician practitioner and as supervising physician I was immediately available for consultation/collaboration.  EKG Interpretation   None         Junius Argyle, MD 11/19/13 [phone]

## 2013-11-20 LAB — CULTURE, GROUP A STREP

## 2013-12-22 ENCOUNTER — Encounter (HOSPITAL_COMMUNITY): Payer: Self-pay | Admitting: Emergency Medicine

## 2013-12-22 ENCOUNTER — Emergency Department (INDEPENDENT_AMBULATORY_CARE_PROVIDER_SITE_OTHER)
Admission: EM | Admit: 2013-12-22 | Discharge: 2013-12-22 | Disposition: A | Discharge status: Home / Self Care | Payer: Self-pay | Source: Home / Self Care | Attending: Emergency Medicine | Admitting: Emergency Medicine

## 2013-12-22 DIAGNOSIS — L0390 Cellulitis, unspecified: Secondary | ICD-10-CM

## 2013-12-22 DIAGNOSIS — L0291 Cutaneous abscess, unspecified: Secondary | ICD-10-CM

## 2013-12-22 MED ORDER — HYDROCODONE-ACETAMINOPHEN 5-325 MG PO TABS: ORAL_TABLET | ORAL | Status: DC

## 2013-12-22 MED ORDER — DOXYCYCLINE HYCLATE 100 MG PO TABS: 100.0000 mg | ORAL_TABLET | Freq: Two times a day (BID) | ORAL | Status: DC

## 2013-12-22 MED ORDER — MUPIROCIN 2 % EX OINT
TOPICAL_OINTMENT | CUTANEOUS | Status: DC
Start: 2013-12-22 — End: 2015-07-09

## 2013-12-22 NOTE — Discharge Instructions (Signed)
Bacterial infection is a leading cause of boils, abscesses and skin infections.  While this can be the cause of serious infections, it is most often easily treated, prevented, and cured. ° °This bacteria,like other bacterial infections, is something you catch from somebody or something.  It can be transmitted from family, friends, casual acquaintances or even pets by close person-to-person contact or contact with an infected object.  Bacteria live on the skin and in the nasal passages.  It can invade into the skin through a break in the skin or sometimes it just invades by itself into a skin pore causing an area of redness, swelling and pain.  When this happens, it can cause a sticking sensation, so many people mistake it for an insect bite.   ° °If it causes a collection of pus (a boil or abscess) it should be drained.  Often packing or a drain will be left in place to allow the pus to drain for a few days.  This packing will need to be removed after 2 to 3 days.  If the wound is deep, it may need to be repacked.  If there is no collection of pus (cellulitis) incision and drainage is not immediately necessary, but antibiotics will be prescribed.  Sometimes cellulitis will turn into an abscess, so if symptoms persist, it's best to return for a recheck. ° °With bacterial infection, recurrence can be a problem.  This is because the bacteria can continue to live on the skin and in the nasal passages, presenting a risk for re-infection.  There are some steps that you can take to completely eradicate the infection and thus prevent future infections: ° °· Finish up your antibiotics completely.   °· Bacteria often take up residence in the nasal passages.  If they are not eliminated from this reservoir, the infection will return again and again.  Apply an antibiotic ointment, mupirocin, to both nostrils 3 times daily for a month.  °· Decontamination of the skin is also necessary. The current recommendation is Clorox baths  twice weekly for 3 months.  This can be done by diluting 4 oz of Clorox bleach in a tub of bathwater.  Then soak in this Clorox solution up to your neck for 15 minutes. When you get out of the tub, do not towel dry, allow the Clorox water to dry off your skin on its own.  °· Take infectious precautions:  Wash or sanitize hands frequently, spray tub or shower with Lysol after use, us towels or wash cloths only once, then launder, do not share towels or wash cloths with anyone else, do not share clothing with anyone else, launder clothing and bed clothes frequently. ° ° ° ° ° °

## 2013-12-22 NOTE — ED Provider Notes (Signed)
  Chief Complaint    Chief Complaint  Patient presents with  . Skin Problem    History of Present Illness      Satvik L Armwood is a 23 year old male who has had a three-day history of a tender nodule on his right forearm. There has been no drainage and he denies any fever or chills. He has history of abscesses on his right pectoral area and his right axilla, but this was months ago, last summer. He has never had a culture.  Review of Systems   Other than as noted above, the patient denies any of the following symptoms: Systemic:  No fever, chills, or myalgias. ENT:  No nasal congestion, rhinorrhea, sore throat, swelling of lips, tongue or throat. Resp:  No cough, wheezing, or shortness of breath.  PMFSH    Past medical history, family history, social history, meds, and allergies were reviewed. He is allergic to sulfa. He has a history of asthma.  Physical Exam     Vital signs:  BP 118/75  Pulse 88  Temp(Src) 98.2 F (36.8 C) (Oral)  Resp 18  SpO2 97% Gen:  Alert, oriented, in no distress. ENT:  Pharynx clear, no intraoral lesions, moist mucous membranes. Lungs:  Clear to auscultation. Skin:  There was a firm 1.5 x 1.5 erythematous, raised, tender nodule on the right forearm. This was not fluctuant or draining. There is a small scab at the center of this.  Assessment    The encounter diagnosis was Cellulitis.  I don't think this needs incision and drainage right now. Would like to treat with by mouth antibiotics and hot compresses. The patient was instructed to return if he should get worse for incision and drainage. Since this is a recurring problem, he will need a decontamination protocol.  Plan     1.  Meds:  The following meds were prescribed:   Discharge Medication List as of 12/22/2013  8:59 AM    START taking these medications   Details  doxycycline (VIBRA-TABS) 100 MG tablet Take 1 tablet (100 mg total) by mouth 2 (two) times daily., Starting 12/22/2013, Until  Discontinued, Normal    HYDROcodone-acetaminophen (NORCO/VICODIN) 5-325 MG per tablet 1 to 2 tabs every 4 to 6 hours as needed for pain., Print    mupirocin ointment (BACTROBAN) 2 % Apply to both nostrils TID for 1 month., Normal        2.  Patient Education/Counseling:  The patient was given appropriate handouts, self care instructions, and instructed in symptomatic relief.  4 decontamination, I suggested twice weekly Clorox baths for 3 months and application mupirocin ointment to the nostrils 3 times a day for one month.  3.  Follow up:  The patient was told to follow up here if no better in 3 to 4 days, or sooner if becoming worse in any way, and given some red flag symptoms such as worsening swelling or pain which would prompt immediate return.  Follow up here if necessary.      Reuben Likesavid C Sukari Grist, MD 12/22/13 [phone]

## 2013-12-22 NOTE — ED Notes (Signed)
C/o possible insect bite to right forearm about three days ago.  Area is red, swollen, and tender to the touch.  No otc meds used.  Denies drainage and any other symptoms.

## 2014-12-03 ENCOUNTER — Emergency Department (HOSPITAL_COMMUNITY)
Admission: EM | Admit: 2014-12-03 | Discharge: 2014-12-04 | Disposition: A | Discharge status: Home / Self Care | Payer: Self-pay | Attending: Emergency Medicine | Admitting: Emergency Medicine

## 2014-12-03 ENCOUNTER — Encounter (HOSPITAL_COMMUNITY): Payer: Self-pay | Admitting: Emergency Medicine

## 2014-12-03 ENCOUNTER — Emergency Department (HOSPITAL_COMMUNITY): Payer: Self-pay

## 2014-12-03 DIAGNOSIS — K529 Noninfective gastroenteritis and colitis, unspecified: Secondary | ICD-10-CM | POA: Insufficient documentation

## 2014-12-03 DIAGNOSIS — J45909 Unspecified asthma, uncomplicated: Secondary | ICD-10-CM | POA: Insufficient documentation

## 2014-12-03 DIAGNOSIS — Z880 Allergy status to penicillin: Secondary | ICD-10-CM | POA: Insufficient documentation

## 2014-12-03 DIAGNOSIS — Z8701 Personal history of pneumonia (recurrent): Secondary | ICD-10-CM | POA: Insufficient documentation

## 2014-12-03 DIAGNOSIS — Z87891 Personal history of nicotine dependence: Secondary | ICD-10-CM | POA: Insufficient documentation

## 2014-12-03 LAB — URINALYSIS, ROUTINE W REFLEX MICROSCOPIC
BILIRUBIN URINE: NEGATIVE
GLUCOSE, UA: NEGATIVE mg/dL
HGB URINE DIPSTICK: NEGATIVE
KETONES UR: NEGATIVE mg/dL
Leukocytes, UA: NEGATIVE
Nitrite: NEGATIVE
Protein, ur: NEGATIVE mg/dL
Specific Gravity, Urine: >1.046 — ABNORMAL HIGH (ref 1.005–1.030)
Urobilinogen, UA: 1 mg/dL (ref 0.0–1.0)
pH: 6 (ref 5.0–8.0)

## 2014-12-03 LAB — CBC WITH DIFFERENTIAL/PLATELET
BASOS ABS: 0 10*3/uL (ref 0.0–0.1)
Basophils Relative: 0 % (ref 0–1)
Eosinophils Absolute: 0 10*3/uL (ref 0.0–0.7)
Eosinophils Relative: 0 % (ref 0–5)
HCT: 49 % (ref 39.0–52.0)
Hemoglobin: 16.9 g/dL (ref 13.0–17.0)
LYMPHS ABS: 0.8 10*3/uL (ref 0.7–4.0)
LYMPHS PCT: 7 % — AB (ref 12–46)
MCH: 30.8 pg (ref 26.0–34.0)
MCHC: 34.5 g/dL (ref 30.0–36.0)
MCV: 89.4 fL (ref 78.0–100.0)
MONO ABS: 0.5 10*3/uL (ref 0.1–1.0)
MONOS PCT: 4 % (ref 3–12)
Neutro Abs: 10.4 10*3/uL — ABNORMAL HIGH (ref 1.7–7.7)
Neutrophils Relative %: 89 % — ABNORMAL HIGH (ref 43–77)
PLATELETS: 210 10*3/uL (ref 150–400)
RBC: 5.48 MIL/uL (ref 4.22–5.81)
RDW: 12.7 % (ref 11.5–15.5)
WBC: 11.7 10*3/uL — AB (ref 4.0–10.5)

## 2014-12-03 LAB — COMPREHENSIVE METABOLIC PANEL
ALBUMIN: 4.8 g/dL (ref 3.5–5.2)
ALT: 47 U/L (ref 0–53)
AST: 35 U/L (ref 0–37)
Alkaline Phosphatase: 64 U/L (ref 39–117)
Anion gap: 7 (ref 5–15)
BUN: 15 mg/dL (ref 6–23)
CHLORIDE: 104 meq/L (ref 96–112)
CO2: 28 mmol/L (ref 19–32)
Calcium: 9.4 mg/dL (ref 8.4–10.5)
Creatinine, Ser: 0.88 mg/dL (ref 0.50–1.35)
GFR calc Af Amer: >90 mL/min (ref >90)
GFR calc non Af Amer: >90 mL/min (ref >90)
GLUCOSE: 107 mg/dL — AB (ref 70–99)
Potassium: 3.7 mmol/L (ref 3.5–5.1)
SODIUM: 139 mmol/L (ref 135–145)
Total Bilirubin: 0.9 mg/dL (ref 0.3–1.2)
Total Protein: 8.3 g/dL (ref 6.0–8.3)

## 2014-12-03 LAB — LIPASE, BLOOD: Lipase: 19 U/L (ref 11–59)

## 2014-12-03 MED ORDER — HYDROMORPHONE HCL 1 MG/ML IJ SOLN
1.0000 mg | Freq: Once | INTRAMUSCULAR | Status: AC
  Administered 2014-12-03: 1 mg via INTRAVENOUS
  Filled 2014-12-03: qty 1

## 2014-12-03 MED ORDER — HYDROMORPHONE HCL 1 MG/ML IJ SOLN: 1.0000 mg | Freq: Once | INTRAMUSCULAR | Status: DC

## 2014-12-03 MED ORDER — HYDROCODONE-ACETAMINOPHEN 5-325 MG PO TABS: 1.0000 | ORAL_TABLET | Freq: Four times a day (QID) | ORAL | Status: DC | PRN

## 2014-12-03 MED ORDER — PROMETHAZINE HCL 25 MG PO TABS: 25.0000 mg | ORAL_TABLET | Freq: Three times a day (TID) | ORAL | Status: DC | PRN

## 2014-12-03 MED ORDER — ONDANSETRON HCL 4 MG/2ML IJ SOLN
4.0000 mg | Freq: Once | INTRAMUSCULAR | Status: AC
  Administered 2014-12-03: 4 mg via INTRAVENOUS
  Filled 2014-12-03: qty 2

## 2014-12-03 MED ORDER — KETOROLAC TROMETHAMINE 30 MG/ML IJ SOLN
30.0000 mg | Freq: Once | INTRAMUSCULAR | Status: AC
  Administered 2014-12-03: 30 mg via INTRAVENOUS
  Filled 2014-12-03: qty 1

## 2014-12-03 MED ORDER — PROMETHAZINE HCL 6.25 MG/5ML PO SYRP: 25.0000 mg | ORAL_SOLUTION | Freq: Four times a day (QID) | ORAL | Status: DC | PRN

## 2014-12-03 MED ORDER — SODIUM CHLORIDE 0.9 % IV BOLUS (SEPSIS)
1000.0000 mL | Freq: Once | INTRAVENOUS | Status: AC
Start: 2014-12-03 — End: 2014-12-03
  Administered 2014-12-03: 1000 mL via INTRAVENOUS

## 2014-12-03 MED ORDER — IOHEXOL 300 MG/ML  SOLN
100.0000 mL | Freq: Once | INTRAMUSCULAR | Status: AC | PRN
  Administered 2014-12-03: 100 mL via INTRAVENOUS

## 2014-12-03 MED ORDER — SODIUM CHLORIDE 0.9 % IV BOLUS (SEPSIS)
1000.0000 mL | Freq: Once | INTRAVENOUS | Status: AC
  Administered 2014-12-03: 1000 mL via INTRAVENOUS

## 2014-12-03 NOTE — ED Notes (Signed)
Patient is unable to urinate at this time 

## 2014-12-03 NOTE — ED Notes (Signed)
No vomiting post PO intake,

## 2014-12-03 NOTE — ED Provider Notes (Signed)
CSN: [id number]     Arrival date & time 12/03/14  1702 History   First MD Initiated Contact with Patient 12/03/14 1816     No chief complaint on file.    (Consider location/radiation/quality/duration/timing/severity/associated sxs/prior Treatment) HPI Patient presents to the emergency department with nausea, vomiting, diarrhea and abdominal pain.  The patient states that he has these abdominal pain.  Patient states that it started earlier this morning.  States the diarrhea started 30 minutes prior to arrival.  The patient states that he had some nausea last night.  Patient denies chest pain, shortness of breath, weakness, dizziness, back pain, neck pain, dysuria, fever, rash, hematemesis, bloody stool, incontinence, near syncope or syncope.  He states he did not take any medications prior to arrival.  Patient states that palpation and movement make the pain worse Past Medical History  Diagnosis Date  . Asthma   . Bronchitis   . Pneumonia    History reviewed. No pertinent past surgical history. History reviewed. No pertinent family history. History  Substance Use Topics  . Smoking status: Former Smoker -- 0.50 packs/day  . Smokeless tobacco: Not on file  . Alcohol Use: No    Review of Systems   All other systems negative except as documented in the HPI. All pertinent positives and negatives as reviewed in the HPI. Allergies  Penicillins; Shellfish allergy; and Betadine  Home Medications   Prior to Admission medications   Medication Sig Start Date End Date Taking? Authorizing Provider  doxycycline (VIBRA-TABS) 100 MG tablet Take 1 tablet (100 mg total) by mouth 2 (two) times daily. Patient not taking: Reported on 12/03/2014 12/22/13   Reuben Likesavid C Keller, MD  HYDROcodone-acetaminophen (NORCO/VICODIN) 5-325 MG per tablet 1 to 2 tabs every 4 to 6 hours as needed for pain. Patient not taking: Reported on 12/03/2014 12/22/13   Reuben Likesavid C Keller, MD  HYDROcodone-homatropine [facility](HYCODAN) 5-1.5  MG/5ML syrup Take 5 mLs by mouth every 6 (six) hours as needed for cough. Patient not taking: Reported on 12/03/2014 11/18/13   Antony MaduraKelly Humes, PA-C  mupirocin ointment (BACTROBAN) 2 % Apply to both nostrils TID for 1 month. Patient not taking: Reported on 12/03/2014 12/22/13   Reuben Likesavid C Keller, MD  predniSONE (DELTASONE) 20 MG tablet Take 2 tablets (40 mg total) by mouth daily. Patient not taking: Reported on 12/03/2014 11/18/13   Antony MaduraKelly Humes, PA-C   BP 130/84 mmHg  Pulse 96  Temp(Src) 97.7 F (36.5 C) (Oral)  Resp 16  SpO2 99% Physical Exam  Constitutional: He is oriented to person, place, and time. He appears well-developed and well-nourished. No distress.  HENT:  Head: Normocephalic and atraumatic.  Mouth/Throat: Oropharynx is clear and moist.  Eyes: Pupils are equal, round, and reactive to light.  Neck: Normal range of motion. Neck supple.  Cardiovascular: Normal rate, regular rhythm and normal heart sounds.  Exam reveals no gallop and no friction rub.   No murmur heard. Pulmonary/Chest: Effort normal and breath sounds normal. No respiratory distress.  Abdominal: Soft. Bowel sounds are normal. He exhibits no distension. There is tenderness. There is no rebound and no guarding.  Musculoskeletal: He exhibits no edema.  Neurological: He is alert and oriented to person, place, and time. He exhibits normal muscle tone. Coordination normal.  Skin: Skin is warm and dry. No rash noted. No erythema.  Nursing note and vitals reviewed.   ED Course  Procedures (including critical care time) Labs Review Labs Reviewed  CBC WITH DIFFERENTIAL - Abnormal; Notable for the following:  WBC 11.7 (*)    Neutrophils Relative % 89 (*)    Neutro Abs 10.4 (*)    Lymphocytes Relative 7 (*)    All other components within normal limits  COMPREHENSIVE METABOLIC PANEL - Abnormal; Notable for the following:    Glucose, Bld 107 (*)    All other components within normal limits  LIPASE, BLOOD  URINALYSIS,  ROUTINE W REFLEX MICROSCOPIC    Imaging Review Ct Abdomen Pelvis W Contrast  12/03/2014   CLINICAL DATA:  Additional evaluation for abdominal pain.  EXAM: CT ABDOMEN AND PELVIS WITH CONTRAST  TECHNIQUE: Multidetector CT imaging of the abdomen and pelvis was performed using the standard protocol following bolus administration of intravenous contrast.  CONTRAST:  100mL OMNIPAQUE IOHEXOL 300 MG/ML  SOLN  COMPARISON:  None.  FINDINGS: Visualized lung bases are clear.  No pleural pericardial effusion.  The liver demonstrates a normal contrast enhanced appearance. Gallbladder within normal limits. No biliary dilatation. The spleen, adrenal glands, and pancreas demonstrate a normal contrast enhanced appearance.  Kidneys are equal in size with symmetric enhancement. There is a single punctate nonobstructive 2-3 mm stone in the upper pole the right kidney. This is seen only on coronal on sagittal reconstructed images. No hydronephrosis or focal enhancing renal mass.  Stomach within normal limits. No evidence for obstruction. Appendix well visualized in the right lower quadrant and is of normal caliber and appearance without associated inflammatory changes to suggest acute appendicitis. No abnormal wall thickening, mucosal enhancement, or inflammatory fat stranding seen about the bowels.  Bladder within normal limits.  Prostate are unremarkable.  No free air or fluid. No pathologically enlarged intra-abdominal pelvic lymph nodes.  Normal intravascular enhancement seen within the abdomen and pelvis.  No acute osseous abnormality. No worrisome lytic or blastic osseous lesions.  IMPRESSION: 1. No CT evidence for acute intra-abdominal or pelvic process. 2. Normal appendix. 3. 2-3 mm nonobstructive right renal calculus.   Electronically Signed   By: Rise MuBenjamin  McClintock M.D.   On: 12/03/2014 20:40   Patient has tolerated oral fluids and improvement in his symptoms.  Patient is advised to follow-up with his primary care Dr.  told to return here as needed  MDM   Final diagnoses:  None      Carlyle DollyChristopher W Clevie Prout, PA-C 12/05/14 1513  Flint MelterElliott L Wentz, MD 12/05/14 [phone]

## 2014-12-03 NOTE — ED Notes (Signed)
Pt again reminded of need for urine sample, side rail down and cup provided.

## 2014-12-03 NOTE — Discharge Instructions (Signed)
Return here as needed. Follow up with your doctor or an urgent care.    Food Choices to Help Relieve Diarrhea When you have diarrhea, the foods you eat and your eating habits are very important. Choosing the right foods and drinks can help relieve diarrhea. Also, because diarrhea can last up to 7 days, you need to replace lost fluids and electrolytes (such as sodium, potassium, and chloride) in order to help prevent dehydration.  WHAT GENERAL GUIDELINES DO I NEED TO FOLLOW?  Slowly drink 1 cup (8 oz) of fluid for each episode of diarrhea. If you are getting enough fluid, your urine will be clear or pale yellow.  Eat starchy foods. Some good choices include white rice, white toast, pasta, low-fiber cereal, baked potatoes (without the skin), saltine crackers, and bagels.  Avoid large servings of any cooked vegetables.  Limit fruit to two servings per day. A serving is  cup or 1 small piece.  Choose foods with less than 2 g of fiber per serving.  Limit fats to less than 8 tsp (38 g) per day.  Avoid fried foods.  Eat foods that have probiotics in them. Probiotics can be found in certain dairy products.  Avoid foods and beverages that may increase the speed at which food moves through the stomach and intestines (gastrointestinal tract). Things to avoid include:  High-fiber foods, such as dried fruit, raw fruits and vegetables, nuts, seeds, and whole grain foods.  Spicy foods and high-fat foods.  Foods and beverages sweetened with high-fructose corn syrup, honey, or sugar alcohols such as xylitol, sorbitol, and mannitol. WHAT FOODS ARE RECOMMENDED? Grains White rice. White, [place], or pita breads (fresh or toasted), including plain rolls, buns, or bagels. White pasta. Saltine, soda, or graham crackers. Pretzels. Low-fiber cereal. Cooked cereals made with water (such as cornmeal, farina, or cream cereals). Plain muffins. Matzo. Melba toast. Zwieback.  Vegetables Potatoes (without the  skin). Strained tomato and vegetable juices. Most well-cooked and canned vegetables without seeds. Tender lettuce. Fruits Cooked or canned applesauce, apricots, cherries, fruit cocktail, grapefruit, peaches, pears, or plums. Fresh bananas, apples without skin, cherries, grapes, cantaloupe, grapefruit, peaches, oranges, or plums.  Meat and Other Protein Products Baked or boiled chicken. Eggs. Tofu. Fish. Seafood. Smooth peanut butter. Ground or well-cooked tender beef, ham, veal, lamb, pork, or poultry.  Dairy Plain yogurt, kefir, and unsweetened liquid yogurt. Lactose-free milk, buttermilk, or soy milk. Plain hard cheese. Beverages Sport drinks. Clear broths. Diluted fruit juices (except prune). Regular, caffeine-free sodas such as ginger ale. Water. Decaffeinated teas. Oral rehydration solutions. Sugar-free beverages not sweetened with sugar alcohols. Other Bouillon, broth, or soups made from recommended foods.  The items listed above may not be a complete list of recommended foods or beverages. Contact your dietitian for more options. WHAT FOODS ARE NOT RECOMMENDED? Grains Whole grain, whole wheat, bran, or rye breads, rolls, pastas, crackers, and cereals. Wild or brown rice. Cereals that contain more than 2 g of fiber per serving. Corn tortillas or taco shells. Cooked or dry oatmeal. Granola. Popcorn. Vegetables Raw vegetables. Cabbage, broccoli, Brussels sprouts, artichokes, baked beans, beet greens, corn, kale, legumes, peas, sweet potatoes, and yams. Potato skins. Cooked spinach and cabbage. Fruits Dried fruit, including raisins and dates. Raw fruits. Stewed or dried prunes. Fresh apples with skin, apricots, mangoes, pears, raspberries, and strawberries.  Meat and Other Protein Products Chunky peanut butter. Nuts and seeds. Beans and lentils. Tomasa BlaseBacon.  Dairy High-fat cheeses. Milk, chocolate milk, and beverages made with milk, such  as milk shakes. Cream. Ice cream. Sweets and  Desserts Sweet rolls, doughnuts, and sweet breads. Pancakes and waffles. Fats and Oils Butter. Cream sauces. Margarine. Salad oils. Plain salad dressings. Olives. Avocados.  Beverages Caffeinated beverages (such as coffee, tea, soda, or energy drinks). Alcoholic beverages. Fruit juices with pulp. Prune juice. Soft drinks sweetened with high-fructose corn syrup or sugar alcohols. Other Coconut. Hot sauce. Chili powder. Mayonnaise. Gravy. Cream-based or milk-based soups.  The items listed above may not be a complete list of foods and beverages to avoid. Contact your dietitian for more information. WHAT SHOULD I DO IF I BECOME DEHYDRATED? Diarrhea can sometimes lead to dehydration. Signs of dehydration include dark urine and dry mouth and skin. If you think you are dehydrated, you should rehydrate with an oral rehydration solution. These solutions can be purchased at pharmacies, retail stores, or online.  Drink -1 cup (120-240 mL) of oral rehydration solution each time you have an episode of diarrhea. If drinking this amount makes your diarrhea worse, try drinking smaller amounts more often. For example, drink 1-3 tsp (5-15 mL) every 5-10 minutes.  A general rule for staying hydrated is to drink 1-2 L of fluid per day. Talk to your health care provider about the specific amount you should be drinking each day. Drink enough fluids to keep your urine clear or pale yellow. Document Released: 02/10/2004 Document Revised: 11/25/2013 Document Reviewed: 10/13/2013 [facility] Patient Information 2015 [place], [place]. This information is not intended to replace advice given to you by your health care provider. Make sure you discuss any questions you have with your health care provider.

## 2014-12-03 NOTE — ED Notes (Addendum)
Pt c/o squeezing diffuse abdominal pain and emesis x 3 today, diarrhea onset 30 minutes ago. Pt states he has been unable to eat or drink since last night.

## 2014-12-03 NOTE — ED Notes (Signed)
Pt returned from CT via stretcher.

## 2014-12-03 NOTE — ED Notes (Signed)
Pt again advised of need for urine specimen.  States still unable to urinate.

## 2014-12-03 NOTE — ED Notes (Signed)
D/c delayed d/t patient requesting liquid medications, pt states he is able to take pills but feels like they will hurt his stomach. Attempts made to explain to patient scripts were for pain and nausea medication, pt continues to request liquid medication. PA notified

## 2014-12-03 NOTE — ED Notes (Signed)
Pt provided Apple juice for PO challenge

## 2015-07-09 ENCOUNTER — Emergency Department (HOSPITAL_COMMUNITY): Payer: Self-pay

## 2015-07-09 ENCOUNTER — Encounter (HOSPITAL_COMMUNITY): Payer: Self-pay | Admitting: Emergency Medicine

## 2015-07-09 ENCOUNTER — Emergency Department (HOSPITAL_COMMUNITY)
Admission: EM | Admit: 2015-07-09 | Discharge: 2015-07-10 | Disposition: A | Discharge status: Home / Self Care | Payer: Self-pay | Attending: Emergency Medicine | Admitting: Emergency Medicine

## 2015-07-09 DIAGNOSIS — J45909 Unspecified asthma, uncomplicated: Secondary | ICD-10-CM | POA: Insufficient documentation

## 2015-07-09 DIAGNOSIS — Z8701 Personal history of pneumonia (recurrent): Secondary | ICD-10-CM | POA: Insufficient documentation

## 2015-07-09 DIAGNOSIS — Y9289 Other specified places as the place of occurrence of the external cause: Secondary | ICD-10-CM | POA: Insufficient documentation

## 2015-07-09 DIAGNOSIS — S39012A Strain of muscle, fascia and tendon of lower back, initial encounter: Secondary | ICD-10-CM | POA: Insufficient documentation

## 2015-07-09 DIAGNOSIS — Y9389 Activity, other specified: Secondary | ICD-10-CM | POA: Insufficient documentation

## 2015-07-09 DIAGNOSIS — Z720 Tobacco use: Secondary | ICD-10-CM | POA: Insufficient documentation

## 2015-07-09 DIAGNOSIS — Y998 Other external cause status: Secondary | ICD-10-CM | POA: Insufficient documentation

## 2015-07-09 DIAGNOSIS — Z880 Allergy status to penicillin: Secondary | ICD-10-CM | POA: Insufficient documentation

## 2015-07-09 DIAGNOSIS — S3991XA Unspecified injury of abdomen, initial encounter: Secondary | ICD-10-CM | POA: Insufficient documentation

## 2015-07-09 DIAGNOSIS — X58XXXA Exposure to other specified factors, initial encounter: Secondary | ICD-10-CM | POA: Insufficient documentation

## 2015-07-09 LAB — CBC
HCT: 46.9 % (ref 39.0–52.0)
Hemoglobin: 16.1 g/dL (ref 13.0–17.0)
MCH: 30.9 pg (ref 26.0–34.0)
MCHC: 34.3 g/dL (ref 30.0–36.0)
MCV: 90 fL (ref 78.0–100.0)
Platelets: 151 10*3/uL (ref 150–400)
RBC: 5.21 MIL/uL (ref 4.22–5.81)
RDW: 13 % (ref 11.5–15.5)
WBC: 5.4 10*3/uL (ref 4.0–10.5)

## 2015-07-09 LAB — BASIC METABOLIC PANEL
Anion gap: 11 (ref 5–15)
BUN: 12 mg/dL (ref 6–20)
CO2: 25 mmol/L (ref 22–32)
Calcium: 9.2 mg/dL (ref 8.9–10.3)
Chloride: 104 mmol/L (ref 101–111)
Creatinine, Ser: 1.1 mg/dL (ref 0.61–1.24)
GFR calc Af Amer: >60 mL/min (ref >60)
GFR calc non Af Amer: >60 mL/min (ref >60)
Glucose, Bld: 87 mg/dL (ref 65–99)
Potassium: 4 mmol/L (ref 3.5–5.1)
Sodium: 140 mmol/L (ref 135–145)

## 2015-07-09 LAB — I-STAT TROPONIN, ED: Troponin i, poc: 0 ng/mL (ref 0.00–0.08)

## 2015-07-09 MED ORDER — SODIUM CHLORIDE 0.9 % IV BOLUS (SEPSIS)
1000.0000 mL | Freq: Once | INTRAVENOUS | Status: AC
  Administered 2015-07-09: 1000 mL via INTRAVENOUS

## 2015-07-09 MED ORDER — HYDROMORPHONE HCL 1 MG/ML IJ SOLN
1.0000 mg | Freq: Once | INTRAMUSCULAR | Status: AC
  Administered 2015-07-09: 1 mg via INTRAVENOUS
  Filled 2015-07-09: qty 1

## 2015-07-09 MED ORDER — ONDANSETRON HCL 4 MG/2ML IJ SOLN
4.0000 mg | Freq: Once | INTRAMUSCULAR | Status: AC
  Administered 2015-07-09: 4 mg via INTRAVENOUS
  Filled 2015-07-09: qty 2

## 2015-07-09 NOTE — ED Notes (Signed)
Pt taken to xray prior to this nurse's assessment

## 2015-07-09 NOTE — ED Notes (Signed)
Pt c/o pain to L side of ribs since waking up this morning. Pt denies recent trauma to ribs. Pain worse with mvmt. Pt also c/o pain to L side of chest under L breast area upon inspiration. Describes pain as sharp, intermittent. Pt denies nausea. Pt a/o x 4. Skin warm, dry. No acute distress.

## 2015-07-10 LAB — URINALYSIS, ROUTINE W REFLEX MICROSCOPIC
Bilirubin Urine: NEGATIVE
Glucose, UA: NEGATIVE mg/dL
Hgb urine dipstick: NEGATIVE
Ketones, ur: NEGATIVE mg/dL
Leukocytes, UA: NEGATIVE
Nitrite: NEGATIVE
Protein, ur: NEGATIVE mg/dL
Specific Gravity, Urine: 1.031 — ABNORMAL HIGH (ref 1.005–1.030)
Urobilinogen, UA: 1 mg/dL (ref 0.0–1.0)
pH: 6.5 (ref 5.0–8.0)

## 2015-07-10 LAB — I-STAT TROPONIN, ED: Troponin i, poc: 0 ng/mL (ref 0.00–0.08)

## 2015-07-10 MED ORDER — IBUPROFEN 800 MG PO TABS: 800.0000 mg | ORAL_TABLET | Freq: Three times a day (TID) | ORAL | Status: DC | PRN

## 2015-07-10 MED ORDER — HYDROCODONE-ACETAMINOPHEN 5-325 MG PO TABS: 1.0000 | ORAL_TABLET | Freq: Four times a day (QID) | ORAL | Status: DC | PRN

## 2015-07-10 MED ORDER — KETOROLAC TROMETHAMINE 15 MG/ML IJ SOLN
15.0000 mg | Freq: Once | INTRAMUSCULAR | Status: AC
  Administered 2015-07-10: 15 mg via INTRAVENOUS
  Filled 2015-07-10: qty 1

## 2015-07-10 MED ORDER — CYCLOBENZAPRINE HCL 10 MG PO TABS: 10.0000 mg | ORAL_TABLET | Freq: Three times a day (TID) | ORAL | Status: DC | PRN

## 2015-07-10 NOTE — Discharge Instructions (Signed)
Return here as needed.  Follow-up with the primary care doctor.  Use ice and heat on your back and side

## 2015-07-10 NOTE — ED Provider Notes (Signed)
1:40 AM Patient signed out to me by Ebbie Ridge, PA-C. Patient will be discharged after a second troponin and urinalysis.   Patient's second troponin and urinalysis unremarkable. Patient will be discharged without further evaluation.   [address] [place], PA-C 07/11/15 0038  Raeford Razor, MD 07/11/15 [phone]

## 2015-07-10 NOTE — ED Provider Notes (Signed)
CSN: [id number]     Arrival date & time 07/09/15  2058 History   First MD Initiated Contact with Patient 07/09/15 2143     Chief Complaint  Patient presents with  . Chest Pain     (Consider location/radiation/quality/duration/timing/severity/associated sxs/prior Treatment) HPI   Patient is a 24 year old male with PMHx of asthma who presents to the ED with back pain.  Patient states that he awoke this morning with acute onset of severe back pain.  He thought it was due to sleeping on an older mattress, however the pain worsened throughout the day.  Patient states the pain is in his left, lower back and radiates around his left side and into his chest.  Patient reports the only thing that has made the pain better was lying flat with his feet propped.  Movement makes his pain worse.  The patient denies recent illness or sick contacts.  He denies nausea, vomiting, abdominal pain, dysuria, hematuria, or constipation.  He denies known injury or trauma to his back.   Past Medical History  Diagnosis Date  . Asthma   . Bronchitis   . Pneumonia    History reviewed. No pertinent past surgical history. Family History  Problem Relation Age of Onset  . Hypertension Mother   . Congestive Heart Failure Maternal Grandmother   . Hypertension Maternal Grandmother    History  Substance Use Topics  . Smoking status: Current Every Day Smoker -- 0.50 packs/day    Types: Cigarettes  . Smokeless tobacco: Not on file  . Alcohol Use: Yes     Comment: occasionally    Review of Systems All other systems negative except as documented in the HPI. All pertinent positives and negatives as reviewed in the HPI.   Allergies  Penicillins; Shellfish allergy; and Betadine  Home Medications   Prior to Admission medications   Not on File   BP 150/71 mmHg  Pulse 95  Temp(Src) 98.8 F (37.1 C) (Oral)  Resp 14  Ht  (1.778 m)  Wt 230 lb (104.327 kg)  BMI 33.00 kg/m2  SpO2 99% Physical Exam   Constitutional: He is oriented to person, place, and time. He appears well-developed and well-nourished. No distress.  HENT:  Head: Normocephalic and atraumatic.  Mouth/Throat: Oropharynx is clear and moist.  Eyes: Pupils are equal, round, and reactive to light.  Neck: Normal range of motion. Neck supple.  Cardiovascular: Normal rate, regular rhythm and normal heart sounds.  Exam reveals no gallop and no friction rub.   No murmur heard. Pulmonary/Chest: Effort normal and breath sounds normal. No respiratory distress. He has no wheezes. He has no rales.  Abdominal: Soft. He exhibits no distension. There is tenderness (mild, left). There is no rebound.  Musculoskeletal: He exhibits no edema.  Tenderness to palpation in left back and flank.  Decreased range of motion with flexion and extension.  Neurological: He is alert and oriented to person, place, and time.  Strength 5/5 in lower extremities.  Sensation intact in lower extremities.    Skin: Skin is warm and dry.  Psychiatric: He has a normal mood and affect. His behavior is normal.  Nursing note and vitals reviewed.   ED Course  Procedures (including critical care time) Labs Review Labs Reviewed  BASIC METABOLIC PANEL  CBC  URINALYSIS, ROUTINE W REFLEX MICROSCOPIC (NOT AT [facility])  Rosezena Sensor, ED    Imaging Review Dg Chest 2 View  07/09/2015   CLINICAL DATA:  Left lower back pain  and flank pain, onset earlier today. Sharp pain radiates to the left side of the chest, pleuritic.  EXAM: CHEST  2 VIEW  COMPARISON:  11/18/2013  FINDINGS: The heart size and mediastinal contours are within normal limits. Both lungs are clear. The visualized skeletal structures are unremarkable.  IMPRESSION: No active cardiopulmonary disease.   Electronically Signed   By: Ellery Plunk M.D.   On: 07/09/2015 22:04   Ct Renal Stone Study  07/10/2015   CLINICAL DATA:  LEFT rib pain since waking this morning. Pain is worse with movement, sharp and  intermittent.  EXAM: CT ABDOMEN AND PELVIS WITHOUT CONTRAST  TECHNIQUE: Multidetector CT imaging of the abdomen and pelvis was performed following the standard protocol without IV contrast.  COMPARISON:  CT abdomen and pelvis December 03, 2014  FINDINGS: LUNG BASES: Included view of the lung bases are clear. The visualized heart and pericardium are unremarkable.  KIDNEYS/BLADDER: Kidneys are orthotopic, demonstrating normal size and morphology. 2 mm nonobstructing RIGHT interpolar nephrolithiasis. No hydronephrosis; limited assessment for renal masses on this nonenhanced examination. The unopacified ureters are normal in course and caliber. Urinary bladder is partially distended and unremarkable.  SOLID ORGANS: The liver, spleen, gallbladder, pancreas and adrenal glands are unremarkable for this non-contrast examination.  GASTROINTESTINAL TRACT: The stomach, small and large bowel are normal in course and caliber without inflammatory changes, the sensitivity may be decreased by lack of enteric contrast. Normal appendix.  PERITONEUM/RETROPERITONEUM: Aortoiliac vessels are normal in course and caliber. No lymphadenopathy by CT size criteria. Prostate size is normal. No intraperitoneal free fluid nor free air.  SOFT TISSUES/ OSSEOUS STRUCTURES: Nonsuspicious. Mild inguinal lymphadenopathy is likely reactive.  IMPRESSION: No acute intra-abdominal or pelvic process. No specific findings to explain LEFT rib pain.  2 mm nonobstructing RIGHT nephrolithiasis.   Electronically Signed   By: Awilda Metro M.D.   On: 07/10/2015 00:19      Patient has left-sided flank pain radiates into the back.  This is most likely musculoskeletal.  He is advised return here as needed.  Told to increase his fluid intake, use ice and heat on the area that is sore.  She agrees the plan and all questions were answered  Charlestine Night, PA-C 07/11/15 1610  Raeford Razor, MD 07/11/15 [phone]

## 2015-12-25 ENCOUNTER — Other Ambulatory Visit: Payer: Self-pay | Admitting: Pediatrics

## 2015-12-25 DIAGNOSIS — Z20818 Contact with and (suspected) exposure to other bacterial communicable diseases: Secondary | ICD-10-CM

## 2015-12-25 MED ORDER — AZITHROMYCIN 250 MG PO TABS: ORAL_TABLET | ORAL | Status: DC

## 2015-12-25 NOTE — Progress Notes (Signed)
Close contact was seen in clinic with + pertussis.  Close contacts are being treated for pertussis.

## 2016-04-21 ENCOUNTER — Emergency Department (HOSPITAL_COMMUNITY): Payer: Self-pay

## 2016-04-21 ENCOUNTER — Encounter (HOSPITAL_COMMUNITY): Payer: Self-pay

## 2016-04-21 ENCOUNTER — Emergency Department (HOSPITAL_COMMUNITY)
Admission: EM | Admit: 2016-04-21 | Discharge: 2016-04-21 | Disposition: A | Discharge status: Home / Self Care | Payer: Self-pay | Attending: Emergency Medicine | Admitting: Emergency Medicine

## 2016-04-21 DIAGNOSIS — F17210 Nicotine dependence, cigarettes, uncomplicated: Secondary | ICD-10-CM | POA: Insufficient documentation

## 2016-04-21 DIAGNOSIS — Z79899 Other long term (current) drug therapy: Secondary | ICD-10-CM | POA: Insufficient documentation

## 2016-04-21 DIAGNOSIS — R0789 Other chest pain: Secondary | ICD-10-CM | POA: Insufficient documentation

## 2016-04-21 DIAGNOSIS — J45909 Unspecified asthma, uncomplicated: Secondary | ICD-10-CM | POA: Insufficient documentation

## 2016-04-21 LAB — CBC
HCT: 45.5 % (ref 39.0–52.0)
HEMOGLOBIN: 15.8 g/dL (ref 13.0–17.0)
MCH: 31.4 pg (ref 26.0–34.0)
MCHC: 34.7 g/dL (ref 30.0–36.0)
MCV: 90.5 fL (ref 78.0–100.0)
Platelets: 195 10*3/uL (ref 150–400)
RBC: 5.03 MIL/uL (ref 4.22–5.81)
RDW: 13.2 % (ref 11.5–15.5)
WBC: 9.5 10*3/uL (ref 4.0–10.5)

## 2016-04-21 LAB — I-STAT CHEM 8, ED
BUN: 20 mg/dL (ref 6–20)
CHLORIDE: 105 mmol/L (ref 101–111)
Calcium, Ion: 1.1 mmol/L — ABNORMAL LOW (ref 1.12–1.23)
Creatinine, Ser: 1 mg/dL (ref 0.61–1.24)
GLUCOSE: 117 mg/dL — AB (ref 65–99)
HCT: 49 % (ref 39.0–52.0)
Hemoglobin: 16.7 g/dL (ref 13.0–17.0)
Potassium: 6.6 mmol/L (ref 3.5–5.1)
Sodium: 139 mmol/L (ref 135–145)
TCO2: 28 mmol/L (ref 0–100)

## 2016-04-21 LAB — BASIC METABOLIC PANEL
Anion gap: 4 — ABNORMAL LOW (ref 5–15)
BUN: 15 mg/dL (ref 6–20)
CALCIUM: 9.1 mg/dL (ref 8.9–10.3)
CO2: 26 mmol/L (ref 22–32)
CREATININE: 1.05 mg/dL (ref 0.61–1.24)
Chloride: 108 mmol/L (ref 101–111)
GFR calc non Af Amer: >60 mL/min (ref >60)
Glucose, Bld: 122 mg/dL — ABNORMAL HIGH (ref 65–99)
Potassium: 4 mmol/L (ref 3.5–5.1)
SODIUM: 138 mmol/L (ref 135–145)

## 2016-04-21 LAB — URINALYSIS, ROUTINE W REFLEX MICROSCOPIC
BILIRUBIN URINE: NEGATIVE
Glucose, UA: NEGATIVE mg/dL
Hgb urine dipstick: NEGATIVE
Ketones, ur: NEGATIVE mg/dL
LEUKOCYTES UA: NEGATIVE
NITRITE: NEGATIVE
PH: 5.5 (ref 5.0–8.0)
PROTEIN: NEGATIVE mg/dL
Specific Gravity, Urine: 1.04 — ABNORMAL HIGH (ref 1.005–1.030)

## 2016-04-21 LAB — I-STAT TROPONIN, ED: TROPONIN I, POC: 0 ng/mL (ref 0.00–0.08)

## 2016-04-21 MED ORDER — ONDANSETRON HCL 4 MG/2ML IJ SOLN
4.0000 mg | Freq: Once | INTRAMUSCULAR | Status: AC
  Administered 2016-04-21: 4 mg via INTRAVENOUS
  Filled 2016-04-21: qty 2

## 2016-04-21 MED ORDER — KETOROLAC TROMETHAMINE 30 MG/ML IJ SOLN
30.0000 mg | Freq: Once | INTRAMUSCULAR | Status: AC
  Administered 2016-04-21: 30 mg via INTRAVENOUS
  Filled 2016-04-21: qty 1

## 2016-04-21 MED ORDER — IBUPROFEN 800 MG PO TABS: 800.0000 mg | ORAL_TABLET | Freq: Three times a day (TID) | ORAL | Status: DC

## 2016-04-21 MED ORDER — TRAMADOL HCL 50 MG PO TABS: 50.0000 mg | ORAL_TABLET | Freq: Four times a day (QID) | ORAL | Status: DC | PRN

## 2016-04-21 MED ORDER — MORPHINE SULFATE (PF) 4 MG/ML IV SOLN
4.0000 mg | Freq: Once | INTRAVENOUS | Status: AC
  Administered 2016-04-21: 4 mg via INTRAVENOUS
  Filled 2016-04-21: qty 1

## 2016-04-21 NOTE — ED Provider Notes (Addendum)
CSN: [id number]     Arrival date & time 04/21/16  0020 History  By signing my name below, I, Donald Fisher, attest that this documentation has been prepared under the direction and in the presence of Donald Creasehristopher J Pollina, MD. Electronically Signed: Randell PatientMarrissa Fisher, ED Scribe. 04/21/2016. 3:29 AM.   Chief Complaint  Patient presents with  . Chest Pain   The history is provided by the patient. No language interpreter was used.   HPI Comments: Donald Fisher is a 25 y.o. male with an hx of asthma who presents to the Emergency Department complaining of constant, sudden onset, sore CP onset 1 hour ago while playing a game. Pt states that the pain began in the right side of his chest but has since moved to his central chest. CP is worse with movement. Denies difficulty breathing or any other symptoms currently.  Past Medical History  Diagnosis Date  . Asthma   . Bronchitis   . Pneumonia    History reviewed. No pertinent past surgical history. Family History  Problem Relation Age of Onset  . Hypertension Mother   . Congestive Heart Failure Maternal Grandmother   . Hypertension Maternal Grandmother    Social History  Substance Use Topics  . Smoking status: Current Every Day Smoker -- 0.50 packs/day    Types: Cigarettes  . Smokeless tobacco: None  . Alcohol Use: Yes     Comment: occasionally    Review of Systems  Respiratory: Negative for shortness of breath.   Cardiovascular: Positive for chest pain.  All other systems reviewed and are negative.  Allergies  Penicillins; Shellfish allergy; and Betadine  Home Medications   Prior to Admission medications   Medication Sig Start Date End Date Taking? Authorizing Provider  ibuprofen (ADVIL,MOTRIN) 800 MG tablet Take 1 tablet (800 mg total) by mouth every 8 (eight) hours as needed. 07/10/15  Yes Christopher Lawyer, PA-C  azithromycin (ZITHROMAX) 250 MG tablet Take 2 tablets (500 mg) by mouth on day 1, then take 1 tablet (250  mg) by mouth once daily on days 2-5. 12/25/15   Voncille LoKate Ettefagh, MD  cyclobenzaprine (FLEXERIL) 10 MG tablet Take 1 tablet (10 mg total) by mouth 3 (three) times daily as needed for muscle spasms. 07/10/15   Charlestine Nighthristopher Lawyer, PA-C  HYDROcodone-acetaminophen (NORCO/VICODIN) 5-325 MG per tablet Take 1 tablet by mouth every 6 (six) hours as needed for moderate pain. 07/10/15   Christopher Lawyer, PA-C   BP 139/103 mmHg  Pulse 70  Temp(Src) 98 F (36.7 C) (Oral)  Resp 14  Ht 5\' 9"  (1.753 m)  Wt 235 lb (106.595 kg)  BMI 34.69 kg/m2  SpO2 98% Physical Exam  Constitutional: He is oriented to person, place, and time. He appears well-developed and well-nourished. No distress.  HENT:  Head: Normocephalic and atraumatic.  Right Ear: Hearing normal.  Left Ear: Hearing normal.  Nose: Nose normal.  Mouth/Throat: Oropharynx is clear and moist and mucous membranes are normal.  Eyes: Conjunctivae and EOM are normal. Pupils are equal, round, and reactive to light.  Neck: Normal range of motion. Neck supple.  Cardiovascular: Regular rhythm, S1 normal and S2 normal.  Exam reveals no gallop and no friction rub.   No murmur heard. Pulmonary/Chest: Effort normal and breath sounds normal. No respiratory distress. He exhibits tenderness.  Severe tenderness over anterior chest wall. Reproducible with movement of the torso.  Abdominal: Soft. Normal appearance and bowel sounds are normal. There is no hepatosplenomegaly. There is no tenderness. There is no  rebound, no guarding, no tenderness at McBurney's point and negative Murphy's sign. No hernia.  Musculoskeletal: Normal range of motion.  Neurological: He is alert and oriented to person, place, and time. He has normal strength. No cranial nerve deficit or sensory deficit. Coordination normal. GCS eye subscore is 4. GCS verbal subscore is 5. GCS motor subscore is 6.  Skin: Skin is warm, dry and intact. No rash noted. No cyanosis.  Psychiatric: He has a normal mood  and affect. His speech is normal and behavior is normal. Thought content normal.  Nursing note and vitals reviewed.   ED Course  Procedures (including critical care time)  DIAGNOSTIC STUDIES: Oxygen Saturation is 99% on RA, normal by my interpretation.    COORDINATION OF CARE: 1:18 AM Will order chest x-ray and labs. Discussed treatment plan with pt at bedside and pt agreed to plan.   Labs Review Labs Reviewed  BASIC METABOLIC PANEL - Abnormal; Notable for the following:    Glucose, Bld 122 (*)    Anion gap 4 (*)    All other components within normal limits  I-STAT CHEM 8, ED - Abnormal; Notable for the following:    Potassium 6.6 (*)    Glucose, Bld 117 (*)    Calcium, Ion 1.10 (*)    All other components within normal limits  CBC  URINALYSIS, ROUTINE W REFLEX MICROSCOPIC (NOT AT [facility])  I-STAT TROPOININ, ED    Imaging Review Dg Chest 2 View  04/21/2016  CLINICAL DATA:  Sudden onset of right sided chest pain about one hour ago, pain is moving to center of chest, patient is unable to move or sit up due to pain EXAM: CHEST  2 VIEW COMPARISON:  07/09/2015 FINDINGS: Shallow inspiration. The heart size and mediastinal contours are within normal limits. Both lungs are clear. The visualized skeletal structures are unremarkable. IMPRESSION: No active cardiopulmonary disease. Electronically Signed   By: Burman Nieves M.D.   On: 04/21/2016 01:46   I have personally reviewed and evaluated these images and lab results as part of my medical decision-making.   EKG Interpretation   Date/Time:  Friday Apr 21 2016 00:29:23 EDT Ventricular Rate:  78 PR Interval:  151 QRS Duration: 99 QT Interval:  361 QTC Calculation: 411 R Axis:   30 Text Interpretation:  Sinus rhythm Borderline repolarization abnormality  Borderline ST elevation, anterior leads No significant change since last  tracing Confirmed by POLLINA  MD, CHRISTOPHER [phone]) on 04/21/2016 1:04:38  AM      MDM   Final  diagnoses:  None  Chest wall pain  Patient presents to the ER for evaluation of chest pain. Patient reports that pain began approximately one hour before coming to the ER. He describes it as a sharp pain in the center of his chest that goes into his back. Patient reports severe worsening of pain if he moves at all. He feels better lying flat on the bed and not moving. There is no associated shortness of breath. He does have a history of asthma and bronchitis, no active wheezing or bronchospasm noted. Oxygen saturation is 99% on arrival. Based on the fact that the pain is extremely reproducible with any palpation of the chest wall or minimal movements of his torso, cardiac etiology is not suspected. Troponin negative. EKG does not show any change from previous EKGs. Chest x-ray is clear. No mediastinal abnormality to suggest aortic dissection. Patient did have borderline tachycardia at arrival but this resolved with analgesia. Patient not felt  to be exhibiting signs of PE at this time. Patient feeling better after analgesia.  I personally performed the services described in this documentation, which was scribed in my presence. The recorded information has been reviewed and is accurate.    Donald Crease, MD 04/21/16 1610  Donald Crease, MD 04/21/16 [phone]

## 2016-04-21 NOTE — Discharge Instructions (Signed)

## 2016-04-21 NOTE — ED Notes (Signed)
Pt complains of chest pain that started about one hour ago, he describes it like needles

## 2016-07-03 ENCOUNTER — Ambulatory Visit (HOSPITAL_COMMUNITY): Payer: Self-pay

## 2016-07-03 ENCOUNTER — Ambulatory Visit (HOSPITAL_COMMUNITY)
Admission: EM | Admit: 2016-07-03 | Discharge: 2016-07-03 | Disposition: A | Discharge status: Home / Self Care | Payer: Self-pay | Attending: Family Medicine | Admitting: Family Medicine

## 2016-07-03 ENCOUNTER — Encounter (HOSPITAL_COMMUNITY): Payer: Self-pay | Admitting: *Deleted

## 2016-07-03 DIAGNOSIS — Z880 Allergy status to penicillin: Secondary | ICD-10-CM | POA: Insufficient documentation

## 2016-07-03 DIAGNOSIS — Z91013 Allergy to seafood: Secondary | ICD-10-CM | POA: Insufficient documentation

## 2016-07-03 DIAGNOSIS — F17210 Nicotine dependence, cigarettes, uncomplicated: Secondary | ICD-10-CM | POA: Insufficient documentation

## 2016-07-03 DIAGNOSIS — M25511 Pain in right shoulder: Secondary | ICD-10-CM | POA: Insufficient documentation

## 2016-07-03 MED ORDER — HYDROCODONE-ACETAMINOPHEN 5-325 MG PO TABS
1.0000 | ORAL_TABLET | Freq: Once | ORAL | Status: AC
  Administered 2016-07-03: 1 via ORAL

## 2016-07-03 MED ORDER — NAPROXEN 500 MG PO TABS: 500.0000 mg | ORAL_TABLET | Freq: Two times a day (BID) | ORAL | 0 refills | Status: DC

## 2016-07-03 MED ORDER — HYDROCODONE-ACETAMINOPHEN 5-325 MG PO TABS
ORAL_TABLET | ORAL | Status: AC
  Filled 2016-07-03: qty 1

## 2016-07-03 NOTE — Discharge Instructions (Signed)
Call for appt with ortho when possible  wear sling and use medicine as needd for comfort.

## 2016-07-03 NOTE — ED Provider Notes (Signed)
[facility]    CSN: [id number] Arrival date & time: 07/03/16  1416  First Provider Contact:  First MD Initiated Contact with Patient 07/03/16 1514        History   Chief Complaint Chief Complaint  Patient presents with  . Shoulder Pain    HPI Cortez L Turri is a 25 y.o. male.    Shoulder Pain  Location:  Shoulder Shoulder location:  R shoulder Injury: no   Pain details:    Quality:  Sharp   Severity:  Moderate   Onset quality:  Sudden   Duration:  3 days   Progression:  Worsening Dislocation: yes (4-5 time dislocation approx 2-3 yrs ago.)   Prior injury to area:  No Associated symptoms: decreased range of motion   Associated symptoms: no back pain and no neck pain     Past Medical History:  Diagnosis Date  . Asthma   . Bronchitis   . Pneumonia     There are no active problems to display for this patient.   History reviewed. No pertinent surgical history.     Home Medications    Prior to Admission medications   Medication Sig Start Date End Date Taking? Authorizing Provider  azithromycin (ZITHROMAX) 250 MG tablet Take 2 tablets (500 mg) by mouth on day 1, then take 1 tablet (250 mg) by mouth once daily on days 2-5. 12/25/15   Voncille Lo, MD  cyclobenzaprine (FLEXERIL) 10 MG tablet Take 1 tablet (10 mg total) by mouth 3 (three) times daily as needed for muscle spasms. 07/10/15   Charlestine Night, PA-C  HYDROcodone-acetaminophen (NORCO/VICODIN) 5-325 MG per tablet Take 1 tablet by mouth every 6 (six) hours as needed for moderate pain. 07/10/15   Charlestine Night, PA-C  ibuprofen (ADVIL,MOTRIN) 800 MG tablet Take 1 tablet (800 mg total) by mouth 3 (three) times daily. 04/21/16   Gilda Crease, MD  naproxen (NAPROSYN) 500 MG tablet Take 1 tablet (500 mg total) by mouth 2 (two) times daily with a meal. 07/03/16   Linna Hoff, MD  traMADol (ULTRAM) 50 MG tablet Take 1 tablet (50 mg total) by mouth every 6 (six) hours as needed. 04/21/16    Gilda Crease, MD    Family History Family History  Problem Relation Age of Onset  . Hypertension Mother   . Congestive Heart Failure Maternal Grandmother   . Hypertension Maternal Grandmother     Social History Social History  Substance Use Topics  . Smoking status: Current Every Day Smoker    Packs/day: 0.50    Types: Cigarettes  . Smokeless tobacco: Not on file  . Alcohol use Yes     Comment: occasionally     Allergies   Penicillins; Shellfish allergy; and Betadine [povidone iodine]   Review of Systems Review of Systems  Constitutional: Negative.   Musculoskeletal: Positive for arthralgias. Negative for back pain, gait problem, joint swelling and neck pain.  All other systems reviewed and are negative.    Physical Exam Triage Vital Signs ED Triage Vitals  Enc Vitals Group     BP 07/03/16 1515 150/100     Pulse Rate 07/03/16 1515 78     Resp 07/03/16 1515 18     Temp 07/03/16 1515 98.6 F (37 C)     Temp Source 07/03/16 1515 Oral     SpO2 07/03/16 1515 100 %     Weight --      Height --      Head  Circumference --      Peak Flow --      Pain Score 07/03/16 1516 4     Pain Loc --      Pain Edu? --      Excl. in GC? --    No data found.   Updated Vital Signs BP 150/100 (BP Location: Right Arm)   Pulse 78   Temp 98.6 F (37 C) (Oral)   Resp 18   SpO2 100%   Visual Acuity Right Eye Distance:   Left Eye Distance:   Bilateral Distance:    Right Eye Near:   Left Eye Near:    Bilateral Near:     Physical Exam  Constitutional: He appears well-developed and well-nourished. He appears distressed.  Musculoskeletal: He exhibits tenderness.       Right shoulder: He exhibits decreased range of motion, tenderness, bony tenderness, pain, spasm and decreased strength. He exhibits no swelling, no deformity and normal pulse.       Arms: Nursing note and vitals reviewed.    UC Treatments / Results  Labs (all labs ordered are listed, but only  abnormal results are displayed) Labs Reviewed - No data to display  EKG  EKG Interpretation None       Radiology No results found.  Procedures Procedures (including critical care time)  Medications Ordered in UC Medications  HYDROcodone-acetaminophen (NORCO/VICODIN) 5-325 MG per tablet 1 tablet (1 tablet Oral Given 07/03/16 1705)     Initial Impression / Assessment and Plan / UC Course  I have reviewed the triage vital signs and the nursing notes.  Pertinent labs & imaging results that were available during my care of the patient were reviewed by me and considered in my medical decision making (see chart for details).  Clinical Course  Value Comment By Time  DG Shoulder Right (Reviewed) Linna Hoff, MD 07/31 1634      Final Clinical Impressions(s) / UC Diagnoses   Final diagnoses:  Shoulder pain, right    New Prescriptions Discharge Medication List as of 07/03/2016  4:55 PM    START taking these medications   Details  naproxen (NAPROSYN) 500 MG tablet Take 1 tablet (500 mg total) by mouth 2 (two) times daily with a meal., Starting Mon 07/03/2016, Print         Linna Hoff, MD 07/11/16 2047

## 2016-07-03 NOTE — ED Triage Notes (Signed)
Pt  Reports      Has recurrent  Episodes  And  His  shoulder popping  Out   He  States  It  Is  In place  At this  Time

## 2016-07-06 ENCOUNTER — Other Ambulatory Visit (HOSPITAL_COMMUNITY): Payer: Self-pay | Admitting: Orthopaedic Surgery

## 2016-07-06 DIAGNOSIS — M25511 Pain in right shoulder: Secondary | ICD-10-CM

## 2016-07-07 ENCOUNTER — Ambulatory Visit (HOSPITAL_COMMUNITY)
Admission: RE | Admit: 2016-07-07 | Discharge: 2016-07-07 | Disposition: A | Discharge status: Home / Self Care | Payer: PRIVATE HEALTH INSURANCE | Source: Ambulatory Visit | Attending: Orthopaedic Surgery | Admitting: Orthopaedic Surgery

## 2016-07-07 DIAGNOSIS — M25511 Pain in right shoulder: Secondary | ICD-10-CM | POA: Diagnosis present

## 2016-07-07 DIAGNOSIS — S43491A Other sprain of right shoulder joint, initial encounter: Secondary | ICD-10-CM | POA: Diagnosis not present

## 2016-07-07 DIAGNOSIS — X58XXXA Exposure to other specified factors, initial encounter: Secondary | ICD-10-CM | POA: Insufficient documentation

## 2016-07-07 MED ORDER — LIDOCAINE HCL (PF) 1 % IJ SOLN: 10.0000 mL | Freq: Once | INTRAMUSCULAR | Status: DC

## 2016-07-07 MED ORDER — GADOBENATE DIMEGLUMINE 529 MG/ML IV SOLN
5.0000 mL | Freq: Once | INTRAVENOUS | Status: AC | PRN
  Administered 2016-07-07: 5 mL via INTRA_ARTICULAR

## 2016-07-07 MED ORDER — IOPAMIDOL (ISOVUE-M 200) INJECTION 41%
15.0000 mL | Freq: Once | INTRAMUSCULAR | Status: AC
  Administered 2016-07-07: 15 mL via INTRA_ARTICULAR

## 2016-07-10 NOTE — Progress Notes (Signed)
Radiology Procedure note  -Delayed entry -Uncomplicated fluoro guided right shoulder joint injection for MR using 01.cc Multihance, 15 cc Omnipaque 300, and 5 cc lidocaine.  15 cc total injection. -No complications  JWatts MD

## 2016-07-17 NOTE — Patient Instructions (Signed)
Donald Fisher  07/17/2016   Your procedure is scheduled on: 07/21/2016    [facility]  [facility] to 3rd floor to  Short Stay Center at     115pm  Call this number if you have problems the morning of surgery [phone]   Remember: ONLY 1 PERSON MAY GO WITH YOU TO SHORT STAY TO GET  READY MORNING OF YOUR SURGERY.  Do not eat food after midnite.  May have clear liquids from 12 midnite until 0830am then nothing by mouth.       Take these medicines the morning of surgery with A SIP OF WATER:                                 You may not have any metal on your body including hair pins and              piercings  Do not wear jewelry,  lotions, powders or perfumes, deodorant              Men may shave face and neck.   Do not bring valuables to the hospital. [place] IS NOT             RESPONSIBLE   FOR VALUABLES.  Contacts, dentures or bridgework may not be worn into surgery.      Patients discharged the day of surgery will not be allowed to drive home.  Name and phone number of your driver:  Special Instructions: coughing and deep breathing exercises, leg exercises               Please read over the following fact sheets you were given: _____________________________________________________________________                CLEAR LIQUID DIET   Foods Allowed                                                                     Foods Excluded  Coffee and tea, regular and decaf                             liquids that you cannot  Plain Jell-O in any flavor                                             see through such as: Fruit ices (not with fruit pulp)                                     milk, soups, orange juice  Iced Popsicles                                    All solid food Carbonated beverages, regular and diet  Cranberry, grape and apple juices Sports drinks like Gatorade Lightly  seasoned clear broth or consume(fat free) Sugar, honey syrup  Sample Menu Breakfast                                Lunch                                     Supper Cranberry juice                    Beef broth                            Chicken broth Jell-O                                     Grape juice                           Apple juice Coffee or tea                        Jell-O                                      Popsicle                                                Coffee or tea                        Coffee or tea  _____________________________________________________________________  [facility] - Preparing for Surgery Before surgery, you can play an important role.  Because skin is not sterile, your skin needs to be as free of germs as possible.  You can reduce the number of germs on your skin by washing with CHG (chlorahexidine gluconate) soap before surgery.  CHG is an antiseptic cleaner which kills germs and bonds with the skin to continue killing germs even after washing. Please DO NOT use if you have an allergy to CHG or antibacterial soaps.  If your skin becomes reddened/irritated stop using the CHG and inform your nurse when you arrive at Short Stay. Do not shave (including legs and underarms) for at least 48 hours prior to the first CHG shower.  You may shave your face/neck. Please follow these instructions carefully:  1.  Shower with CHG Soap the night before surgery and the  morning of Surgery.  2.  If you choose to wash your hair, wash your hair first as usual with your  normal  shampoo.  3.  After you shampoo, rinse your hair and body thoroughly to remove the  shampoo.                           4.  Use CHG as you would any other liquid soap.  You can apply chg directly  to the skin and wash  Gently with a scrungie or clean washcloth.  5.  Apply the CHG Soap to your body ONLY FROM THE NECK DOWN.   Do not use on face/ open                            Wound or open sores. Avoid contact with eyes, ears mouth and genitals (private parts).                       Wash face,  Genitals (private parts) with your normal soap.             6.  Wash thoroughly, paying special attention to the area where your surgery  will be performed.  7.  Thoroughly rinse your body with warm water from the neck down.  8.  DO NOT shower/wash with your normal soap after using and rinsing off  the CHG Soap.                9.  Pat yourself dry with a clean towel.            10.  Wear clean pajamas.            11.  Place clean sheets on your bed the night of your first shower and do not  sleep with pets. Day of Surgery : Do not apply any lotions/deodorants the morning of surgery.  Please wear clean clothes to the hospital/surgery center.  FAILURE TO FOLLOW THESE INSTRUCTIONS MAY RESULT IN THE CANCELLATION OF YOUR SURGERY PATIENT SIGNATURE_________________________________  NURSE SIGNATURE__________________________________  ________________________________________________________________________

## 2016-07-18 ENCOUNTER — Inpatient Hospital Stay (HOSPITAL_COMMUNITY)
Admission: RE | Admit: 2016-07-18 | Discharge: 2016-07-18 | Disposition: A | Discharge status: Home / Self Care | Payer: PRIVATE HEALTH INSURANCE | Source: Ambulatory Visit

## 2016-07-18 ENCOUNTER — Other Ambulatory Visit: Payer: Self-pay | Admitting: Physician Assistant

## 2016-07-19 ENCOUNTER — Encounter (HOSPITAL_COMMUNITY)
Admission: RE | Admit: 2016-07-19 | Discharge: 2016-07-19 | Disposition: A | Discharge status: Home / Self Care | Payer: PRIVATE HEALTH INSURANCE | Source: Ambulatory Visit | Attending: Orthopaedic Surgery | Admitting: Orthopaedic Surgery

## 2016-07-19 ENCOUNTER — Encounter (HOSPITAL_COMMUNITY): Payer: Self-pay

## 2016-07-19 DIAGNOSIS — F17210 Nicotine dependence, cigarettes, uncomplicated: Secondary | ICD-10-CM | POA: Diagnosis not present

## 2016-07-19 DIAGNOSIS — X58XXXA Exposure to other specified factors, initial encounter: Secondary | ICD-10-CM | POA: Diagnosis not present

## 2016-07-19 DIAGNOSIS — M24411 Recurrent dislocation, right shoulder: Secondary | ICD-10-CM | POA: Diagnosis not present

## 2016-07-19 DIAGNOSIS — S43491A Other sprain of right shoulder joint, initial encounter: Secondary | ICD-10-CM | POA: Diagnosis not present

## 2016-07-19 DIAGNOSIS — M25311 Other instability, right shoulder: Secondary | ICD-10-CM | POA: Diagnosis not present

## 2016-07-19 HISTORY — DX: Gastro-esophageal reflux disease without esophagitis: K21.9

## 2016-07-19 HISTORY — DX: Cardiac murmur, unspecified: R01.1

## 2016-07-19 LAB — CBC
HCT: 46.8 % (ref 39.0–52.0)
HEMOGLOBIN: 16.1 g/dL (ref 13.0–17.0)
MCH: 30.7 pg (ref 26.0–34.0)
MCHC: 34.4 g/dL (ref 30.0–36.0)
MCV: 89.1 fL (ref 78.0–100.0)
Platelets: 211 10*3/uL (ref 150–400)
RBC: 5.25 MIL/uL (ref 4.22–5.81)
RDW: 12.9 % (ref 11.5–15.5)
WBC: 9.4 10*3/uL (ref 4.0–10.5)

## 2016-07-19 NOTE — Patient Instructions (Addendum)
Donald Fisher  07/19/2016   Your procedure is scheduled on: 07/21/2016    [facility]  [facility]  [facility] to 3rd floor to  Short Stay Center at    115pm  Call this number if you have problems the morning of surgery [phone]   Remember: ONLY 1 PERSON MAY GO WITH YOU TO SHORT STAY TO GET  READY MORNING OF YOUR SURGERY.  Do not eat food after midnitel  May have clear liquids from 12 midnite until 0830am then nothing by mouth.       Take these medicines the morning of surgery with A SIP OF WATER: tylenol # 3 if needed                                 You may not have any metal on your body including hair pins and              piercings  Do not wear jewelry,  lotions, powders or perfumes, deodorant                        Men may shave face and neck.   Do not bring valuables to the hospital. [place] IS NOT             RESPONSIBLE   FOR VALUABLES.  Contacts, dentures or bridgework may not be worn into surgery.       Patients discharged the day of surgery will not be allowed to drive home.  Name and phone number of your driver:  Special Instructions: coughing and deep breathing exercises, leg exercises               Please read over the following fact sheets you were given: _____________________________________________________________________                CLEAR LIQUID DIET   Foods Allowed                                                                     Foods Excluded  Coffee and tea, regular and decaf                             liquids that you cannot  Plain Jell-O in any flavor                                             see through such as: Fruit ices (not with fruit pulp)                                     milk, soups, orange juice  Iced Popsicles  All solid food Carbonated beverages, regular and diet                                    Cranberry, grape and apple juices Sports  drinks like Gatorade Lightly seasoned clear broth or consume(fat free) Sugar, honey syrup  Sample Menu Breakfast                                Lunch                                     Supper Cranberry juice                    Beef broth                            Chicken broth Jell-O                                     Grape juice                           Apple juice Coffee or tea                        Jell-O                                      Popsicle                                                Coffee or tea                        Coffee or tea  _____________________________________________________________________  [facility] - Preparing for Surgery Before surgery, you can play an important role.  Because skin is not sterile, your skin needs to be as free of germs as possible.  You can reduce the number of germs on your skin by washing with CHG (chlorahexidine gluconate) soap before surgery.  CHG is an antiseptic cleaner which kills germs and bonds with the skin to continue killing germs even after washing. Please DO NOT use if you have an allergy to CHG or antibacterial soaps.  If your skin becomes reddened/irritated stop using the CHG and inform your nurse when you arrive at Short Stay. Do not shave (including legs and underarms) for at least 48 hours prior to the first CHG shower.  You may shave your face/neck. Please follow these instructions carefully:  1.  Shower with CHG Soap the night before surgery and the  morning of Surgery.  2.  If you choose to wash your hair, wash your hair first as usual with your  normal  shampoo.  3.  After you shampoo, rinse your hair and body thoroughly to remove the  shampoo.  4.  Use CHG as you would any other liquid soap.  You can apply chg directly  to the skin and wash                       Gently with a scrungie or clean washcloth.  5.  Apply the CHG Soap to your body ONLY FROM THE NECK DOWN.   Do not use on face/  open                           Wound or open sores. Avoid contact with eyes, ears mouth and genitals (private parts).                       Wash face,  Genitals (private parts) with your normal soap.             6.  Wash thoroughly, paying special attention to the area where your surgery  will be performed.  7.  Thoroughly rinse your body with warm water from the neck down.  8.  DO NOT shower/wash with your normal soap after using and rinsing off  the CHG Soap.                9.  Pat yourself dry with a clean towel.            10.  Wear clean pajamas.            11.  Place clean sheets on your bed the night of your first shower and do not  sleep with pets. Day of Surgery : Do not apply any lotions/deodorants the morning of surgery.  Please wear clean clothes to the hospital/surgery center.  FAILURE TO FOLLOW THESE INSTRUCTIONS MAY RESULT IN THE CANCELLATION OF YOUR SURGERY PATIENT SIGNATURE_________________________________  NURSE SIGNATURE__________________________________  ________________________________________________________________________   Donald Fisher  An incentive spirometer is a tool that can help keep your lungs clear and active. This tool measures how well you are filling your lungs with each breath. Taking long deep breaths may help reverse or decrease the chance of developing breathing (pulmonary) problems (especially infection) following:  A long period of time when you are unable to move or be active. BEFORE THE PROCEDURE   If the spirometer includes an indicator to show your best effort, your nurse or respiratory therapist will set it to a desired goal.  If possible, sit up straight or lean slightly forward. Try not to slouch.  Hold the incentive spirometer in an upright position. INSTRUCTIONS FOR USE  1. Sit on the edge of your bed if possible, or sit up as far as you can in bed or on a chair. 2. Hold the incentive spirometer in an upright  position. 3. Breathe out normally. 4. Place the mouthpiece in your mouth and seal your lips tightly around it. 5. Breathe in slowly and as deeply as possible, raising the piston or the ball toward the top of the column. 6. Hold your breath for 3-5 seconds or for as long as possible. Allow the piston or ball to fall to the bottom of the column. 7. Remove the mouthpiece from your mouth and breathe out normally. 8. Rest for a few seconds and repeat Steps 1 through 7 at least 10 times every 1-2 hours when you are awake. Take your time and take a few normal breaths between deep breaths. 9. The spirometer may include an indicator to  show your best effort. Use the indicator as a goal to work toward during each repetition. 10. After each set of 10 deep breaths, practice coughing to be sure your lungs are clear. If you have an incision (the cut made at the time of surgery), support your incision when coughing by placing a pillow or rolled up towels firmly against it. Once you are able to get out of bed, walk around indoors and cough well. You may stop using the incentive spirometer when instructed by your caregiver.  RISKS AND COMPLICATIONS  Take your time so you do not get dizzy or light-headed.  If you are in pain, you may need to take or ask for pain medication before doing incentive spirometry. It is harder to take a deep breath if you are having pain. AFTER USE  Rest and breathe slowly and easily.  It can be helpful to keep track of a log of your progress. Your caregiver can provide you with a simple table to help with this. If you are using the spirometer at home, follow these instructions: [place] IF:   You are having difficultly using the spirometer.  You have trouble using the spirometer as often as instructed.  Your pain medication is not giving enough relief while using the spirometer.  You develop fever of 100.5 F (38.1 C) or higher. SEEK IMMEDIATE MEDICAL CARE IF:    You cough up bloody sputum that had not been present before.  You develop fever of 102 F (38.9 C) or greater.  You develop worsening pain at or near the incision site. MAKE SURE YOU:   Understand these instructions.  Will watch your condition.  Will get help right away if you are not doing well or get worse. Document Released: 04/02/2007 Document Revised: 02/12/2012 Document Reviewed: 06/03/2007 [facility] Patient Information 2014 [place], [place].   ________________________________________________________________________

## 2016-07-19 NOTE — Progress Notes (Signed)
EKG AND CXR 04/21/16- EPIC

## 2016-07-21 ENCOUNTER — Ambulatory Visit (HOSPITAL_COMMUNITY): Payer: PRIVATE HEALTH INSURANCE | Admitting: Anesthesiology

## 2016-07-21 ENCOUNTER — Ambulatory Visit (HOSPITAL_COMMUNITY)
Admission: RE | Admit: 2016-07-21 | Discharge: 2016-07-21 | Disposition: A | Discharge status: Home / Self Care | Payer: PRIVATE HEALTH INSURANCE | Source: Ambulatory Visit | Attending: Orthopaedic Surgery | Admitting: Orthopaedic Surgery

## 2016-07-21 ENCOUNTER — Encounter (HOSPITAL_COMMUNITY)
Admission: RE | Disposition: A | Discharge status: Home / Self Care | Payer: Self-pay | Source: Ambulatory Visit | Attending: Orthopaedic Surgery

## 2016-07-21 ENCOUNTER — Encounter (HOSPITAL_COMMUNITY): Payer: Self-pay | Admitting: *Deleted

## 2016-07-21 DIAGNOSIS — X58XXXA Exposure to other specified factors, initial encounter: Secondary | ICD-10-CM | POA: Insufficient documentation

## 2016-07-21 DIAGNOSIS — S43491A Other sprain of right shoulder joint, initial encounter: Secondary | ICD-10-CM | POA: Insufficient documentation

## 2016-07-21 DIAGNOSIS — M24411 Recurrent dislocation, right shoulder: Secondary | ICD-10-CM | POA: Diagnosis not present

## 2016-07-21 DIAGNOSIS — S43431A Superior glenoid labrum lesion of right shoulder, initial encounter: Secondary | ICD-10-CM

## 2016-07-21 DIAGNOSIS — S43439A Superior glenoid labrum lesion of unspecified shoulder, initial encounter: Secondary | ICD-10-CM

## 2016-07-21 DIAGNOSIS — M25311 Other instability, right shoulder: Secondary | ICD-10-CM | POA: Insufficient documentation

## 2016-07-21 DIAGNOSIS — F17210 Nicotine dependence, cigarettes, uncomplicated: Secondary | ICD-10-CM | POA: Insufficient documentation

## 2016-07-21 HISTORY — PX: SHOULDER ARTHROSCOPY WITH LABRAL REPAIR: SHX5691

## 2016-07-21 SURGERY — ARTHROSCOPY, SHOULDER, WITH GLENOID LABRUM REPAIR
Anesthesia: Regional | Site: Shoulder | Laterality: Right

## 2016-07-21 MED ORDER — PROPOFOL 10 MG/ML IV BOLUS
INTRAVENOUS | Status: DC | PRN
  Administered 2016-07-21: 200 mg via INTRAVENOUS
  Administered 2016-07-21 (×2): 100 mg via INTRAVENOUS

## 2016-07-21 MED ORDER — LACTATED RINGERS IV SOLN
INTRAVENOUS | Status: DC
  Administered 2016-07-21: 15:00:00 via INTRAVENOUS

## 2016-07-21 MED ORDER — CLINDAMYCIN PHOSPHATE 900 MG/50ML IV SOLN
INTRAVENOUS | Status: AC
  Filled 2016-07-21: qty 50

## 2016-07-21 MED ORDER — PROPOFOL 10 MG/ML IV BOLUS
INTRAVENOUS | Status: AC
  Filled 2016-07-21: qty 20

## 2016-07-21 MED ORDER — BUPIVACAINE HCL (PF) 0.25 % IJ SOLN
INTRAMUSCULAR | Status: AC
  Filled 2016-07-21: qty 30

## 2016-07-21 MED ORDER — EPINEPHRINE HCL 1 MG/ML IJ SOLN
INTRAMUSCULAR | Status: AC
  Filled 2016-07-21: qty 2

## 2016-07-21 MED ORDER — MIDAZOLAM HCL 2 MG/2ML IJ SOLN
2.0000 mg | Freq: Once | INTRAMUSCULAR | Status: AC
  Administered 2016-07-21: 2 mg via INTRAVENOUS

## 2016-07-21 MED ORDER — FENTANYL CITRATE (PF) 100 MCG/2ML IJ SOLN
INTRAMUSCULAR | Status: AC
  Filled 2016-07-21: qty 2

## 2016-07-21 MED ORDER — ACETAMINOPHEN 10 MG/ML IV SOLN
1000.0000 mg | Freq: Once | INTRAVENOUS | Status: AC
  Administered 2016-07-21: 1000 mg via INTRAVENOUS

## 2016-07-21 MED ORDER — SODIUM CHLORIDE 0.9 % IJ SOLN
INTRAMUSCULAR | Status: AC
  Filled 2016-07-21: qty 50

## 2016-07-21 MED ORDER — SUCCINYLCHOLINE CHLORIDE 20 MG/ML IJ SOLN
INTRAMUSCULAR | Status: DC | PRN
  Administered 2016-07-21: 100 mg via INTRAVENOUS

## 2016-07-21 MED ORDER — DEXAMETHASONE SODIUM PHOSPHATE 10 MG/ML IJ SOLN
INTRAMUSCULAR | Status: DC | PRN
  Administered 2016-07-21: 10 mg via INTRAVENOUS

## 2016-07-21 MED ORDER — KETOROLAC TROMETHAMINE 30 MG/ML IJ SOLN
INTRAMUSCULAR | Status: AC
  Administered 2016-07-21: 30 mg via INTRAVENOUS
  Filled 2016-07-21: qty 1

## 2016-07-21 MED ORDER — HYDROMORPHONE HCL 1 MG/ML IJ SOLN
INTRAMUSCULAR | Status: DC | PRN
  Administered 2016-07-21 (×2): 1 mg via INTRAVENOUS

## 2016-07-21 MED ORDER — SODIUM CHLORIDE 0.9 % IR SOLN
Status: DC | PRN
  Administered 2016-07-21: 18000 mL

## 2016-07-21 MED ORDER — OXYCODONE-ACETAMINOPHEN 5-325 MG PO TABS: 1.0000 | ORAL_TABLET | ORAL | 0 refills | Status: DC | PRN

## 2016-07-21 MED ORDER — PROMETHAZINE HCL 25 MG/ML IJ SOLN: 6.2500 mg | INTRAMUSCULAR | Status: DC | PRN

## 2016-07-21 MED ORDER — TIZANIDINE HCL 4 MG PO TABS: 4.0000 mg | ORAL_TABLET | Freq: Four times a day (QID) | ORAL | 0 refills | Status: DC | PRN

## 2016-07-21 MED ORDER — LIDOCAINE HCL (CARDIAC) 20 MG/ML IV SOLN
INTRAVENOUS | Status: DC | PRN
  Administered 2016-07-21: 100 mg via INTRAVENOUS

## 2016-07-21 MED ORDER — ACETAMINOPHEN 10 MG/ML IV SOLN
INTRAVENOUS | Status: AC
  Filled 2016-07-21: qty 100

## 2016-07-21 MED ORDER — HYDROMORPHONE HCL 2 MG/ML IJ SOLN
INTRAMUSCULAR | Status: AC
  Filled 2016-07-21: qty 1

## 2016-07-21 MED ORDER — OXYCODONE HCL 5 MG PO TABS
5.0000 mg | ORAL_TABLET | Freq: Once | ORAL | Status: DC | PRN
Start: 2016-07-21 — End: 2016-07-22

## 2016-07-21 MED ORDER — PHENYLEPHRINE HCL 10 MG/ML IJ SOLN
INTRAMUSCULAR | Status: DC | PRN
  Administered 2016-07-21: 80 ug via INTRAVENOUS

## 2016-07-21 MED ORDER — MIDAZOLAM HCL 2 MG/2ML IJ SOLN
INTRAMUSCULAR | Status: AC
  Filled 2016-07-21: qty 2

## 2016-07-21 MED ORDER — ONDANSETRON HCL 4 MG/2ML IJ SOLN
INTRAMUSCULAR | Status: DC | PRN
  Administered 2016-07-21: 4 mg via INTRAVENOUS

## 2016-07-21 MED ORDER — BUPIVACAINE-EPINEPHRINE (PF) 0.5% -1:200000 IJ SOLN
INTRAMUSCULAR | Status: AC
  Filled 2016-07-21: qty 30

## 2016-07-21 MED ORDER — FENTANYL CITRATE (PF) 100 MCG/2ML IJ SOLN
INTRAMUSCULAR | Status: DC | PRN
  Administered 2016-07-21: 25 ug via INTRAVENOUS
  Administered 2016-07-21 (×3): 50 ug via INTRAVENOUS
  Administered 2016-07-21: 25 ug via INTRAVENOUS

## 2016-07-21 MED ORDER — KETOROLAC TROMETHAMINE 30 MG/ML IJ SOLN
30.0000 mg | Freq: Once | INTRAMUSCULAR | Status: AC
Start: 2016-07-21 — End: 2016-07-21
  Administered 2016-07-21: 30 mg via INTRAVENOUS

## 2016-07-21 MED ORDER — CHLORHEXIDINE GLUCONATE 4 % EX LIQD: 60.0000 mL | Freq: Once | CUTANEOUS | Status: DC

## 2016-07-21 MED ORDER — FENTANYL CITRATE (PF) 100 MCG/2ML IJ SOLN
25.0000 ug | INTRAMUSCULAR | Status: DC | PRN
  Administered 2016-07-21 (×2): 50 ug via INTRAVENOUS

## 2016-07-21 MED ORDER — NAPROXEN 500 MG PO TABS: 500.0000 mg | ORAL_TABLET | Freq: Two times a day (BID) | ORAL | 0 refills | Status: DC | PRN

## 2016-07-21 MED ORDER — FENTANYL CITRATE (PF) 100 MCG/2ML IJ SOLN
INTRAMUSCULAR | Status: AC
  Administered 2016-07-21: 50 ug via INTRAVENOUS
  Filled 2016-07-21: qty 2

## 2016-07-21 MED ORDER — OXYCODONE HCL 5 MG/5ML PO SOLN
5.0000 mg | Freq: Once | ORAL | Status: DC | PRN
  Filled 2016-07-21: qty 5

## 2016-07-21 MED ORDER — CLINDAMYCIN PHOSPHATE 900 MG/50ML IV SOLN
900.0000 mg | INTRAVENOUS | Status: AC
  Administered 2016-07-21: 900 mg via INTRAVENOUS

## 2016-07-21 MED ORDER — FENTANYL CITRATE (PF) 100 MCG/2ML IJ SOLN
50.0000 ug | Freq: Once | INTRAMUSCULAR | Status: AC
  Administered 2016-07-21: 50 ug via INTRAVENOUS

## 2016-07-21 SURGICAL SUPPLY — 63 items
ANCH SUT PUSHLCK 15.5X2.9 (Orthopedic Implant) ×3 IMPLANT
ANCHOR PUSHLOCK BIOCOMP 2.9X15 (Orthopedic Implant) ×6 IMPLANT
BLADE 4.2CUDA (BLADE) ×2 IMPLANT
BLADE CUDA 4.2 (BLADE) IMPLANT
BLADE CUDA GRT WHITE 3.5 (BLADE) ×3 IMPLANT
BLADE CUDA SHAVER 3.5 (BLADE) ×2 IMPLANT
BLADE CUTTER GATOR 3.5 (BLADE) IMPLANT
BLADE GREAT WHITE 4.2 (BLADE) ×2 IMPLANT
BLADE GREAT WHITE 4.2MM (BLADE) ×1
BLADE SURG 11 STRL SS (BLADE) ×3 IMPLANT
BLADE SURG 15 STRL LF DISP TIS (BLADE) ×1 IMPLANT
BLADE SURG 15 STRL SS (BLADE) ×3
BLADE SURG SZ11 CARB STEEL (BLADE) IMPLANT
BUR 3.5 LG SPHERICAL (BURR) IMPLANT
BUR OVAL 6.0 (BURR) ×3 IMPLANT
BUR VERTEX HOODED 4.5 (BURR) IMPLANT
BURR 3.5 LG SPHERICAL (BURR)
BURR 3.5MM LG SPHERICAL (BURR)
CANNULA 5.75X7 CRYSTAL CLEAR (CANNULA) ×3 IMPLANT
CANNULA 5.75X71 LONG (CANNULA) ×2 IMPLANT
CANNULA SHOULDER 7CM (CANNULA) ×3 IMPLANT
CANNULA TWIST IN 8.25X7CM (CANNULA) ×8 IMPLANT
CLOTH BEACON ORANGE TIMEOUT ST (SAFETY) ×3 IMPLANT
COVER MAYO STAND STRL (DRAPES) IMPLANT
DRAPE POUCH INSTRU U-SHP 10X18 (DRAPES) ×3 IMPLANT
DRAPE SHEET LG 3/4 BI-LAMINATE (DRAPES) IMPLANT
DRAPE SHOULDER BEACH CHAIR (DRAPES) ×6 IMPLANT
DRAPE U-SHAPE 47X51 STRL (DRAPES) ×3 IMPLANT
DRSG PAD ABDOMINAL 8X10 ST (GAUZE/BANDAGES/DRESSINGS) ×4 IMPLANT
ELECT REM PT RETURN 9FT ADLT (ELECTROSURGICAL) ×3
ELECTRODE REM PT RTRN 9FT ADLT (ELECTROSURGICAL) ×1 IMPLANT
FIBERSTICK 2 (SUTURE) IMPLANT
GAUZE SPONGE 4X4 12PLY STRL (GAUZE/BANDAGES/DRESSINGS) ×3 IMPLANT
GAUZE XEROFORM 1X8 LF (GAUZE/BANDAGES/DRESSINGS) ×3 IMPLANT
GLOVE BIO SURGEON STRL SZ7.5 (GLOVE) ×3 IMPLANT
GLOVE INDICATOR 8.0 STRL GRN (GLOVE) ×6 IMPLANT
KIT BASIN OR (CUSTOM PROCEDURE TRAY) ×3 IMPLANT
KIT PUSHLOCK 2.9 HIP (KITS) ×2 IMPLANT
KIT SHOULDER TRACTION (DRAPES) ×3 IMPLANT
LASSO 90 CVE QUICKPAS (DISPOSABLE) ×2 IMPLANT
NDL SCORPION (NEEDLE) IMPLANT
NDL SPNL 18GX3.5 QUINCKE PK (NEEDLE) ×1 IMPLANT
NEEDLE HYPO 22GX1.5 SAFETY (NEEDLE) ×3 IMPLANT
NEEDLE SCORPION (NEEDLE) IMPLANT
NEEDLE SPNL 18GX3.5 QUINCKE PK (NEEDLE) ×3 IMPLANT
NS IRRIG 500ML POUR BTL (IV SOLUTION) IMPLANT
PACK SHOULDER (CUSTOM PROCEDURE TRAY) IMPLANT
SLEEVE ARTHREX VELCRO STAR (Orthopedic Implant) ×4 IMPLANT
SLING ULTRA II AB L (ORTHOPEDIC SUPPLIES) IMPLANT
SLING ULTRA II AB S (ORTHOPEDIC SUPPLIES) IMPLANT
SPONGE LAP 4X18 X RAY DECT (DISPOSABLE) IMPLANT
SUCTION FRAZIER HANDLE 10FR (MISCELLANEOUS)
SUCTION TUBE FRAZIER 10FR DISP (MISCELLANEOUS) IMPLANT
SUT ETHILON 3 0 PS 1 (SUTURE) ×3 IMPLANT
TAPE LABRALWHITE 1.5X36 (TAPE) ×4 IMPLANT
TOWEL OR 17X24 6PK STRL BLUE (TOWEL DISPOSABLE) ×3 IMPLANT
TUBE CONNECTING 12'X1/4 (SUCTIONS) ×2
TUBE CONNECTING 12X1/4 (SUCTIONS) ×4 IMPLANT
TUBING ARTHRO INFLOW-ONLY STRL (TUBING) ×3 IMPLANT
WAND HAND CNTRL MULTIVAC 90 (MISCELLANEOUS) ×3 IMPLANT
WATER STERILE IRR 500ML POUR (IV SOLUTION) ×3 IMPLANT
YANKAUER SUCT BULB TIP 10FT TU (MISCELLANEOUS) ×2 IMPLANT
YANKAUER SUCT BULB TIP NO VENT (SUCTIONS) IMPLANT

## 2016-07-21 NOTE — Anesthesia Procedure Notes (Signed)
Anesthesia Regional Block:  Interscalene brachial plexus block  Pre-Anesthetic Checklist: ,, timeout performed, Correct Patient, Correct Site, Correct Laterality, Correct Procedure, Correct Position, site marked, Risks and benefits discussed,  Surgical consent,  Pre-op evaluation,  At surgeon's request and post-op pain management  Laterality: Right  Prep: chloraprep       Needles:  Injection technique: Single-shot  Needle Type: Echogenic Needle     Needle Length: 5cm 5 cm Needle Gauge: 22 and 22 G    Additional Needles:  Procedures: ultrasound guided (picture in chart) Interscalene brachial plexus block Narrative:  Start time: 07/21/2016 3:00 PM End time: 07/21/2016 3:10 PM Injection made incrementally with aspirations every 25 mL.  Performed by: Personally  Anesthesiologist: Bonita QuinGUIDETTI, Donald Stainback S

## 2016-07-21 NOTE — Discharge Instructions (Signed)
Try to stay in your right arm sling at all times. Expect right shoulder swelling - ice throughout the next few days as needed. Do expect some bloody drainage. You can remove your dressings in 24-48 hours and then start placing band-aids daily over your incisions. You may remove your sling to shower and to occasionally move your elbow and arm in from of you. DO NOT TRY TO REACH OVERHEAD OR BEHIND YOU WITH YOUR RIGHT ARM AT ALL FOR THE NEXT 4-6 WEEKS.

## 2016-07-21 NOTE — H&P (Signed)
Donald Fisher is an 25 y.o. male.   Chief Complaint:   Recurrent dislocations of right shoulder HPI:   25 yo male with recurrent subluxations and dislocations of his right shoulder.  A recent MRI confirms an anterior labral tear.  Surgery has been recommended to stabilize his shoulder.  Past Medical History:  Diagnosis Date  . Asthma    hx of   . Bronchitis   . GERD (gastroesophageal reflux disease)   . Heart murmur    hx of   . Pneumonia    hx of     No past surgical history on file.  Family History  Problem Relation Age of Onset  . Hypertension Mother   . Congestive Heart Failure Maternal Grandmother   . Hypertension Maternal Grandmother    Social History:  reports that he has been smoking Cigarettes.  He has a 2.50 pack-year smoking history. He has never used smokeless tobacco. He reports that he drinks alcohol. He reports that he does not use drugs.  Allergies:  Allergies  Allergen Reactions  . Penicillins Hives    Has patient had a PCN reaction causing immediate rash, facial/tongue/throat swelling, SOB or lightheadedness with hypotension: No Has patient had a PCN reaction causing severe rash involving mucus membranes or skin necrosis: No Has patient had a PCN reaction that required hospitalization: No Has patient had a PCN reaction occurring within the last 10 years: Yes If all of the above answers are "NO", then may proceed with Cephalosporin use.   . Shellfish Allergy Swelling    Oysters  . Betadine [Povidone Iodine] Rash    As a child    No prescriptions prior to admission.    Results for orders placed or performed during the hospital encounter of 07/19/16 (from the past 48 hour(s))  CBC     Status: None   Collection Time: 07/19/16  2:00 PM  Result Value Ref Range   WBC 9.4 4.0 - 10.5 K/uL   RBC 5.25 4.22 - 5.81 MIL/uL   Hemoglobin 16.1 13.0 - 17.0 g/dL   HCT 40.946.8 81.139.0 - 91.452.0 %   MCV 89.1 78.0 - 100.0 fL   MCH 30.7 26.0 - 34.0 pg   MCHC 34.4 30.0 - 36.0  g/dL   RDW 78.212.9 95.611.5 - 21.315.5 %   Platelets 211 150 - 400 K/uL   No results found.  Review of Systems  All other systems reviewed and are negative.   There were no vitals taken for this visit. Physical Exam  Constitutional: He appears well-developed and well-nourished.  HENT:  Head: Normocephalic and atraumatic.  Eyes: EOM are normal. Pupils are equal, round, and reactive to light.  Neck: Normal range of motion. Neck supple.  Cardiovascular: Normal rate and regular rhythm.   Respiratory: Effort normal and breath sounds normal.  GI: Soft. Bowel sounds are normal.  Musculoskeletal:       Right shoulder: He exhibits decreased range of motion, bony tenderness and decreased strength.  Neurological: He is alert.  Skin: Skin is warm and dry.  Psychiatric: He has a normal mood and affect.     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  Kathryne HitchBLACKMAN,Rylen Hou Y, MD 07/21/2016, 11:15 AM

## 2016-07-21 NOTE — Anesthesia Preprocedure Evaluation (Signed)
Anesthesia Evaluation  Patient identified by MRN, date of birth, ID band  Reviewed: Allergy & Precautions, NPO status , Patient's Chart, lab work & pertinent test results  Airway Mallampati: II  TM Distance: >3 FB Neck ROM: Full    Dental no notable dental hx.    Pulmonary asthma , Current Smoker,    Pulmonary exam normal        Cardiovascular negative cardio ROS Normal cardiovascular exam     Neuro/Psych negative neurological ROS     GI/Hepatic Neg liver ROS, GERD  ,  Endo/Other  negative endocrine ROS  Renal/GU negative Renal ROS  negative genitourinary   Musculoskeletal   Abdominal   Peds negative pediatric ROS (+)  Hematology negative hematology ROS (+)   Anesthesia Other Findings   Reproductive/Obstetrics                             Anesthesia Physical Anesthesia Plan  ASA: II  Anesthesia Plan: General and Regional   Post-op Pain Management: GA combined w/ Regional for post-op pain   Induction: Intravenous  Airway Management Planned: Oral ETT  Additional Equipment: None  Intra-op Plan:   Post-operative Plan: Extubation in OR  Informed Consent: I have reviewed the patients History and Physical, chart, labs and discussed the procedure including the risks, benefits and alternatives for the proposed anesthesia with the patient or authorized representative who has indicated his/her understanding and acceptance.   Dental advisory given  Plan Discussed with: CRNA  Anesthesia Plan Comments:         Anesthesia Quick Evaluation

## 2016-07-21 NOTE — Transfer of Care (Signed)
Immediate Anesthesia Transfer of Care Note  Patient: Donald Fisher  Procedure(s) Performed: Procedure(s) with comments: RIGHT SHOULDER ARTHROSCOPY WITH LABRAL REPAIR (Right) - Shoulder Block  Patient Location: PACU  Anesthesia Type:General  Level of Consciousness:  sedated, patient cooperative and responds to stimulation  Airway & Oxygen Therapy:Patient Spontanous Breathing and Patient connected to face mask oxgen  Post-op Assessment:  Report given to PACU RN and Post -op Vital signs reviewed and stable  Post vital signs:  Reviewed and stable  Last Vitals:  Vitals:   07/21/16 1545 07/21/16 1845  BP: (!) 150/84   Pulse: 100 90  Resp: 15 16  Temp:  36.8 C    Complications: No apparent anesthesia complications

## 2016-07-21 NOTE — Brief Op Note (Signed)
07/21/2016  6:14 PM  PATIENT:  Donald Fisher  25 y.o. male  PRE-OPERATIVE DIAGNOSIS:  right shoulder labral tear  POST-OPERATIVE DIAGNOSIS:  right shoulder labral tear  PROCEDURE:  Procedure(s) with comments: RIGHT SHOULDER ARTHROSCOPY WITH LABRAL REPAIR (Right) - Shoulder Block  SURGEON:  Surgeon(s) and Role:    * Kathryne Hitchhristopher Y Jeremi Losito, MD - Primary  PHYSICIAN ASSISTANT: Rexene EdisonGil Clark, PA-C  ANESTHESIA:   regional and general  COUNTS:  YES  DICTATION: .Other Dictation: Dictation Number [id number]  PLAN OF CARE: Discharge to home after PACU  PATIENT DISPOSITION:  PACU - hemodynamically stable.   Delay start of Pharmacological VTE agent (>24hrs) due to surgical blood loss or risk of bleeding: not applicable

## 2016-07-21 NOTE — Progress Notes (Signed)
AssistedDr. Armanda MagicGuidetti with right, ultrasound guided, interscalene  block. Side rails up, monitors on throughout procedure. See vital signs in flow sheet. Tolerated Procedure well.

## 2016-07-21 NOTE — Anesthesia Procedure Notes (Signed)
Procedure Name: MAC Date/Time: 07/21/2016 4:22 PM Performed by: Bonita QuinGUIDETTI, Kristoffer Bala S Pre-anesthesia Checklist: Patient identified, Emergency Drugs available, Suction available, Patient being monitored and Timeout performed Patient Re-evaluated:Patient Re-evaluated prior to inductionOxygen Delivery Method: Circle system utilized Preoxygenation: Pre-oxygenation with 100% oxygen Intubation Type: IV induction Ventilation: Mask ventilation without difficulty Laryngoscope Size: 3 and Mac Grade View: Grade I Tube type: Oral Tube size: 7.5 mm Number of attempts: 1 Placement Confirmation: ETT inserted through vocal cords under direct vision,  positive ETCO2,  CO2 detector and breath sounds checked- equal and bilateral Secured at: 23 cm Tube secured with: Tape

## 2016-07-21 NOTE — Anesthesia Postprocedure Evaluation (Signed)
Anesthesia Post Note  Patient: Donald Fisher  Procedure(s) Performed: Procedure(s) (LRB): RIGHT SHOULDER ARTHROSCOPY WITH LABRAL REPAIR (Right)  Patient location during evaluation: PACU Anesthesia Type: General Level of consciousness: awake and alert Pain management: pain level controlled Vital Signs Assessment: post-procedure vital signs reviewed and stable Respiratory status: spontaneous breathing, nonlabored ventilation, respiratory function stable and patient connected to nasal cannula oxygen Cardiovascular status: blood pressure returned to baseline and stable Postop Assessment: no signs of nausea or vomiting Anesthetic complications: no    Last Vitals:  Vitals:   07/21/16 1915 07/21/16 1930  BP: (!) 141/92 (!) 148/88  Pulse: 91 93  Resp: 13 15  Temp:      Last Pain:  Vitals:   07/21/16 1930  TempSrc:   PainSc: Asleep                 Bonita Quinichard S Vandana Haman

## 2016-07-24 NOTE — Op Note (Signed)
[name]:  [name], [name]                ACCOUNT NO.:  [id number]  MEDICAL RECORD NO.:  [id number]  LOCATION:                                 FACILITY:  PHYSICIAN:  Vanita PandaChristopher Y. 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  PROCEDURE:  Right shoulder arthroscopy with anterior labral repair.  SURGEON:  Vanita PandaChristopher Y. Magnus IvanBlackman, MD  ASSISTANT:  Richardean CanalGilbert Clark, PA-C  ANESTHESIA: 1. Regional right shoulder block. 2. General.  BLOOD LOSS:  Minimal.  COMPLICATIONS:  None.  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  We obtained an MRI arthrogram of the right shoulder and did show a large anterior labral tear.  Due to the instability of the shoulder, we recommended arthroscopic labral repair with advancing of the tissues and tightening the tissues down.  He understands that he will need a long recovery to allow shoulders stiffen up and at least 4-6 weeks in a sling. Postoperative risks and benefits of the surgery were explained in detail and he did wish to proceed.  PROCEDURE DESCRIPTION:  After informed consent was obtained, appropriate right shoulder was marked.  He was brought to the operating room.  After a right shoulder block was obtained, he was placed supine on the operating table.  General anesthesia was then obtained.  He was then rolled into a lateral decubitus position with a beanbag underneath him and axillary roll.  We inflated the beanbag to hold him in the lateral position with appropriate positioning and padding around the leg down and the arms.  His right shoulder was  then prepped and draped with DuraPrep and sterile drapes and placed in Arthrex shoulder traction device with 10 pounds of in-line traction as well as overhead traction as well.  A time-out was called and he was identified as correct patient, correct right shoulder.  We then made a posterior lateral arthroscopy portal and entered the glenohumeral joint, while we could see there was a large anterior labral tear.  We made a separate anterior incision for arthroscope.  We placed this in the rotator interval just superior to the subscapularis tendon.  We were able then to mobilize the anterior labrum and found a large flap tear.  We did as much as we could to advance the tendon.  We placed 3 FiberTape sutures through the anterior labrum and tried to advance each of these into bone anchors going up the face of the glenoid.  We felt that this did tighten the tissues down from what we had to work with.  We then cut each of the sutures leaving the anchors into the bone and again we were pleased with the position of the anchors in the labrum.  We then removed all instrumentation from the shoulder, closed the portal sites with interrupted nylon suture and placed a shoulder sling.  He was turned  back to supine position, awakened, extubated, and taken to recovery room in stable condition.  All final counts were correct.  There were no complications noted.  Of note, Richardean CanalGilbert Clark, PA-C's assistance was essential throughout this whole case for helping facilitating this case.     Vanita Pandahristopher Y. Magnus IvanBlackman, M.D.   ______________________________ Vanita Pandahristopher Y. Magnus IvanBlackman, M.D.    CYB/MEDQ  D:  07/21/2016  T:  07/22/2016  Job:  [id number]

## 2016-07-26 ENCOUNTER — Encounter (HOSPITAL_COMMUNITY): Payer: Self-pay | Admitting: Orthopaedic Surgery

## 2016-07-28 ENCOUNTER — Encounter (HOSPITAL_COMMUNITY): Payer: Self-pay | Admitting: Orthopaedic Surgery

## 2016-09-15 ENCOUNTER — Inpatient Hospital Stay (HOSPITAL_COMMUNITY)
Admission: EM | Admit: 2016-09-15 | Discharge: 2016-09-18 | DRG: 203 | Disposition: A | Discharge status: Home / Self Care | Payer: Self-pay | Attending: Internal Medicine | Admitting: Internal Medicine

## 2016-09-15 ENCOUNTER — Encounter (HOSPITAL_COMMUNITY): Payer: Self-pay

## 2016-09-15 ENCOUNTER — Emergency Department (HOSPITAL_COMMUNITY): Payer: Self-pay

## 2016-09-15 DIAGNOSIS — R109 Unspecified abdominal pain: Secondary | ICD-10-CM | POA: Diagnosis present

## 2016-09-15 DIAGNOSIS — K219 Gastro-esophageal reflux disease without esophagitis: Secondary | ICD-10-CM | POA: Diagnosis present

## 2016-09-15 DIAGNOSIS — F17210 Nicotine dependence, cigarettes, uncomplicated: Secondary | ICD-10-CM | POA: Diagnosis present

## 2016-09-15 DIAGNOSIS — Z8249 Family history of ischemic heart disease and other diseases of the circulatory system: Secondary | ICD-10-CM

## 2016-09-15 DIAGNOSIS — J45909 Unspecified asthma, uncomplicated: Secondary | ICD-10-CM

## 2016-09-15 DIAGNOSIS — J45901 Unspecified asthma with (acute) exacerbation: Principal | ICD-10-CM | POA: Diagnosis present

## 2016-09-15 DIAGNOSIS — R1011 Right upper quadrant pain: Secondary | ICD-10-CM

## 2016-09-15 DIAGNOSIS — Z8701 Personal history of pneumonia (recurrent): Secondary | ICD-10-CM

## 2016-09-15 LAB — HEPATIC FUNCTION PANEL
ALBUMIN: 4.2 g/dL (ref 3.5–5.0)
ALT: 57 U/L (ref 17–63)
AST: 39 U/L (ref 15–41)
Alkaline Phosphatase: 61 U/L (ref 38–126)
BILIRUBIN TOTAL: 1.1 mg/dL (ref 0.3–1.2)
Bilirubin, Direct: <0.1 mg/dL — ABNORMAL LOW (ref 0.1–0.5)
TOTAL PROTEIN: 7.4 g/dL (ref 6.5–8.1)

## 2016-09-15 LAB — BASIC METABOLIC PANEL
ANION GAP: 7 (ref 5–15)
BUN: 14 mg/dL (ref 6–20)
CALCIUM: 9 mg/dL (ref 8.9–10.3)
CO2: 27 mmol/L (ref 22–32)
Chloride: 105 mmol/L (ref 101–111)
Creatinine, Ser: 1.1 mg/dL (ref 0.61–1.24)
Glucose, Bld: 100 mg/dL — ABNORMAL HIGH (ref 65–99)
POTASSIUM: 3.9 mmol/L (ref 3.5–5.1)
Sodium: 139 mmol/L (ref 135–145)

## 2016-09-15 LAB — CBC
HCT: 48.1 % (ref 39.0–52.0)
Hemoglobin: 16.2 g/dL (ref 13.0–17.0)
MCH: 31 pg (ref 26.0–34.0)
MCHC: 33.7 g/dL (ref 30.0–36.0)
MCV: 92.1 fL (ref 78.0–100.0)
PLATELETS: 178 10*3/uL (ref 150–400)
RBC: 5.22 MIL/uL (ref 4.22–5.81)
RDW: 13.1 % (ref 11.5–15.5)
WBC: 9.7 10*3/uL (ref 4.0–10.5)

## 2016-09-15 LAB — URINALYSIS, ROUTINE W REFLEX MICROSCOPIC
BILIRUBIN URINE: NEGATIVE
Glucose, UA: NEGATIVE mg/dL
HGB URINE DIPSTICK: NEGATIVE
Ketones, ur: NEGATIVE mg/dL
Leukocytes, UA: NEGATIVE
NITRITE: NEGATIVE
PROTEIN: NEGATIVE mg/dL
Specific Gravity, Urine: 1.031 — ABNORMAL HIGH (ref 1.005–1.030)
pH: 6 (ref 5.0–8.0)

## 2016-09-15 LAB — LIPASE, BLOOD: LIPASE: 21 U/L (ref 11–51)

## 2016-09-15 MED ORDER — METHYLPREDNISOLONE SODIUM SUCC 125 MG IJ SOLR
80.0000 mg | Freq: Three times a day (TID) | INTRAMUSCULAR | Status: DC
  Administered 2016-09-15 – 2016-09-16 (×3): 80 mg via INTRAVENOUS
  Filled 2016-09-15 (×3): qty 2

## 2016-09-15 MED ORDER — KETOROLAC TROMETHAMINE 30 MG/ML IJ SOLN
30.0000 mg | Freq: Two times a day (BID) | INTRAMUSCULAR | Status: DC | PRN
  Administered 2016-09-15 – 2016-09-16 (×2): 30 mg via INTRAVENOUS
  Filled 2016-09-15 (×2): qty 1

## 2016-09-15 MED ORDER — IPRATROPIUM-ALBUTEROL 0.5-2.5 (3) MG/3ML IN SOLN: 3.0000 mL | Freq: Four times a day (QID) | RESPIRATORY_TRACT | Status: DC

## 2016-09-15 MED ORDER — IPRATROPIUM-ALBUTEROL 0.5-2.5 (3) MG/3ML IN SOLN
3.0000 mL | Freq: Four times a day (QID) | RESPIRATORY_TRACT | Status: DC
  Administered 2016-09-15 – 2016-09-18 (×9): 3 mL via RESPIRATORY_TRACT
  Filled 2016-09-15 (×9): qty 3

## 2016-09-15 MED ORDER — IPRATROPIUM-ALBUTEROL 0.5-2.5 (3) MG/3ML IN SOLN
3.0000 mL | Freq: Once | RESPIRATORY_TRACT | Status: AC
  Administered 2016-09-15: 3 mL via RESPIRATORY_TRACT
  Filled 2016-09-15: qty 3

## 2016-09-15 MED ORDER — MORPHINE SULFATE (PF) 4 MG/ML IV SOLN
4.0000 mg | Freq: Once | INTRAVENOUS | Status: AC
  Administered 2016-09-15: 4 mg via INTRAVENOUS
  Filled 2016-09-15: qty 1

## 2016-09-15 MED ORDER — ALBUTEROL (5 MG/ML) CONTINUOUS INHALATION SOLN
10.0000 mg/h | INHALATION_SOLUTION | RESPIRATORY_TRACT | Status: AC
  Administered 2016-09-15: 10 mg/h via RESPIRATORY_TRACT
  Filled 2016-09-15: qty 20

## 2016-09-15 MED ORDER — IOPAMIDOL (ISOVUE-300) INJECTION 61%
100.0000 mL | Freq: Once | INTRAVENOUS | Status: AC | PRN
  Administered 2016-09-15: 100 mL via INTRAVENOUS

## 2016-09-15 MED ORDER — OXYCODONE HCL 5 MG PO TABS
5.0000 mg | ORAL_TABLET | ORAL | Status: DC | PRN
  Administered 2016-09-15 – 2016-09-17 (×3): 5 mg via ORAL
  Filled 2016-09-15 (×3): qty 1

## 2016-09-15 MED ORDER — ACETAMINOPHEN 650 MG RE SUPP
650.0000 mg | Freq: Four times a day (QID) | RECTAL | Status: DC | PRN
Start: 2016-09-15 — End: 2016-09-18

## 2016-09-15 MED ORDER — ONDANSETRON HCL 4 MG/2ML IJ SOLN
4.0000 mg | Freq: Once | INTRAMUSCULAR | Status: AC
  Administered 2016-09-15: 4 mg via INTRAVENOUS
  Filled 2016-09-15: qty 2

## 2016-09-15 MED ORDER — ALBUTEROL SULFATE (2.5 MG/3ML) 0.083% IN NEBU: 2.5000 mg | INHALATION_SOLUTION | RESPIRATORY_TRACT | Status: DC | PRN

## 2016-09-15 MED ORDER — ALBUTEROL (5 MG/ML) CONTINUOUS INHALATION SOLN
10.0000 mg/h | INHALATION_SOLUTION | RESPIRATORY_TRACT | Status: AC
  Administered 2016-09-15: 10 mg/h via RESPIRATORY_TRACT

## 2016-09-15 MED ORDER — DM-GUAIFENESIN ER 30-600 MG PO TB12
1.0000 | ORAL_TABLET | Freq: Two times a day (BID) | ORAL | Status: DC
  Administered 2016-09-15 – 2016-09-18 (×7): 1 via ORAL
  Filled 2016-09-15 (×7): qty 1

## 2016-09-15 MED ORDER — SODIUM CHLORIDE 0.9 % IV SOLN
INTRAVENOUS | Status: DC
  Administered 2016-09-15: 17:00:00 via INTRAVENOUS

## 2016-09-15 MED ORDER — SODIUM CHLORIDE 0.9 % IV BOLUS (SEPSIS)
1000.0000 mL | Freq: Once | INTRAVENOUS | Status: AC
  Administered 2016-09-15: 1000 mL via INTRAVENOUS

## 2016-09-15 MED ORDER — MAGNESIUM SULFATE 2 GM/50ML IV SOLN
2.0000 g | INTRAVENOUS | Status: AC
  Administered 2016-09-15: 2 g via INTRAVENOUS
  Filled 2016-09-15: qty 50

## 2016-09-15 MED ORDER — ACETAMINOPHEN 325 MG PO TABS: 650.0000 mg | ORAL_TABLET | Freq: Four times a day (QID) | ORAL | Status: DC | PRN

## 2016-09-15 MED ORDER — TIZANIDINE HCL 4 MG PO TABS
4.0000 mg | ORAL_TABLET | Freq: Four times a day (QID) | ORAL | Status: DC | PRN
  Administered 2016-09-15: 4 mg via ORAL
  Filled 2016-09-15: qty 1

## 2016-09-15 MED ORDER — HEPARIN SODIUM (PORCINE) 5000 UNIT/ML IJ SOLN
5000.0000 [IU] | Freq: Three times a day (TID) | INTRAMUSCULAR | Status: DC
  Administered 2016-09-15 – 2016-09-16 (×3): 5000 [IU] via SUBCUTANEOUS
  Filled 2016-09-15 (×2): qty 1

## 2016-09-15 MED ORDER — PANTOPRAZOLE SODIUM 40 MG PO TBEC
40.0000 mg | DELAYED_RELEASE_TABLET | Freq: Every day | ORAL | Status: DC
  Administered 2016-09-15 – 2016-09-18 (×4): 40 mg via ORAL
  Filled 2016-09-15 (×3): qty 1

## 2016-09-15 MED ORDER — METHYLPREDNISOLONE SODIUM SUCC 125 MG IJ SOLR
125.0000 mg | Freq: Once | INTRAMUSCULAR | Status: AC
  Administered 2016-09-15: 125 mg via INTRAVENOUS
  Filled 2016-09-15: qty 2

## 2016-09-15 NOTE — H&P (Signed)
TRH H&P   Patient Demographics:    Donald Fisher, is a 25 y.o. male  MRN: [id number]   DOB - 1991/09/20  Admit Date - 09/15/2016  Outpatient Primary MD for the patient is No PCP Per Patient  Referring MD/NP/PA: PA Harris  Patient coming from: Home  Chief Complaint  Patient presents with  . Flank Pain      HPI:    Donald Fisher  is a 25 y.o. male, With past medical history of childhood asthma, [place] multiple hospitalization and admission was occluded, but resolved when he was teenager, no recurrence since, reports over the last 3 days, he has been complaining of cough, nasal congestion and runny nose, but denies any fever, chills, hemoptysis, chest pain, or productive sputum, as well patient has been complaining of abdominal pain, mainly in the right side, he denies any diarrhea, constipation, nausea or vomiting. - In ED patient received IV Solu-Medrol, magnesium sulfate, and to prolonged albuterol inhaled treatment, despite that remains short of breath with significant wheezing, so hospitalist requested to admit, CT abdomen and pelvis was obtained to evaluate for his abdominal pain, no acute findings in CT abdomen and pelvis    Review of systems:    In addition to the HPI above No Fever-chills, No Headache, No changes with Vision or hearing, No problems swallowing food or Liquids, No Chest pain,Complains of cough, shortness of breath and wheezing And planes of right flank pain, No Nausea or Vommitting, Bowel movements are regular, No Blood in stool or Urine, No dysuria, No new skin rashes or bruises, No new joints pains-aches,  No new weakness, tingling, numbness in any extremity, No recent weight gain or loss, No polyuria, polydypsia or polyphagia, No significant Mental Stressors.  A full 10 point Review of Systems was done, except as stated above, all other Review  of Systems were negative.   With Past History of the following :    Past Medical History:  Diagnosis Date  . Asthma    hx of   . Bronchitis   . GERD (gastroesophageal reflux disease)   . Heart murmur    hx of   . Pneumonia    hx of       Past Surgical History:  Procedure Laterality Date  . SHOULDER ARTHROSCOPY WITH LABRAL REPAIR Right 07/21/2016   Procedure: RIGHT SHOULDER ARTHROSCOPY WITH LABRAL REPAIR;  Surgeon: Kathryne Hitch, MD;  Location: [facility];  Service: Orthopedics;  Laterality: Right;  Shoulder Block      Social History:     Social History  Substance Use Topics  . Smoking status: Current Every Day Smoker    Packs/day: 0.25    Years: 10.00    Types: Cigarettes  . Smokeless tobacco: Never Used  . Alcohol use Yes     Comment: occasionally     Lives - At home  Mobility - independent  Family History :     Family History  Problem Relation Age of Onset  . Hypertension Mother   . Congestive Heart Failure Maternal Grandmother   . Hypertension Maternal Grandmother       Home Medications:   Prior to Admission medications   Medication Sig Start Date End Date Taking? Authorizing Provider  Aspirin Effervescent (ALKA-SELTZER PO) Take 1 capsule by mouth daily.   Yes Historical Provider, MD  naproxen (NAPROSYN) 500 MG tablet Take 1 tablet (500 mg total) by mouth 2 (two) times daily as needed (pain). Patient not taking: Reported on 09/15/2016 07/21/16   Kathryne Hitch, MD  oxyCODONE-acetaminophen (ROXICET) 5-325 MG tablet Take 1-2 tablets by mouth every 4 (four) hours as needed. Patient not taking: Reported on 09/15/2016 07/21/16   Kathryne Hitch, MD  tiZANidine (ZANAFLEX) 4 MG tablet Take 1 tablet (4 mg total) by mouth every 6 (six) hours as needed for muscle spasms. Patient not taking: Reported on 09/15/2016 07/21/16   Kathryne Hitch, MD     Allergies:     Allergies  Allergen Reactions  . Penicillins Hives    Has  patient had a PCN reaction causing immediate rash, facial/tongue/throat swelling, SOB or lightheadedness with hypotension: No Has patient had a PCN reaction causing severe rash involving mucus membranes or skin necrosis: No Has patient had a PCN reaction that required hospitalization: No Has patient had a PCN reaction occurring within the last 10 years: Yes If all of the above answers are "NO", then may proceed with Cephalosporin use.   . Shellfish Allergy Swelling    Oysters  . Betadine [Povidone Iodine] Rash    As a child     Physical Exam:   Vitals  Blood pressure 130/80, pulse 102, temperature 98.4 F (36.9 C), temperature source Oral, resp. rate 18, SpO2 99 %.   1. General Well-developed lying in bed in NAD,   2. Normal affect and insight, Not Suicidal or Homicidal, Awake Alert, Oriented X 3.  3. No F.N deficits, ALL C.Nerves Intact, Strength 5/5 all 4 extremities, Sensation intact all 4 extremities, Plantars down going.  4. Ears and Eyes appear Normal, Conjunctivae clear, PERRLA. Moist Oral Mucosa.  5. Supple Neck, No JVD, No cervical lymphadenopathy appriciated, No Carotid Bruits.  6. Symmetrical Chest wall movement, fair air entry bilaterally, diffuse wheezing, able to finish his sentences.  7. RRR, No Gallops, Rubs or Murmurs, No Parasternal Heave.  8. Positive Bowel Sounds, Abdomen Soft, right abdomen tender to palpation(not CVA tenderness), No organomegaly appriciated,No rebound -guarding or rigidity.  9.  No Cyanosis, Normal Skin Turgor, No Skin Rash or Bruise.  10. Good muscle tone,  joints appear normal , no effusions, Normal ROM.  11. No Palpable Lymph Nodes in Neck or Axillae     Data Review:    CBC  Recent Labs Lab 09/15/16 0748  WBC 9.7  HGB 16.2  HCT 48.1  PLT 178  MCV 92.1  MCH 31.0  MCHC 33.7  RDW 13.1   ------------------------------------------------------------------------------------------------------------------  Chemistries    Recent Labs Lab 09/15/16 0748  NA 139  K 3.9  CL 105  CO2 27  GLUCOSE 100*  BUN 14  CREATININE 1.10  CALCIUM 9.0  AST 39  ALT 57  ALKPHOS 61  BILITOT 1.1   ------------------------------------------------------------------------------------------------------------------ CrCl cannot be calculated (Unknown ideal weight.). ------------------------------------------------------------------------------------------------------------------ No results for input(s): TSH, T4TOTAL, T3FREE, THYROIDAB in the last 72 hours.  Invalid input(s): FREET3  Coagulation profile No results for input(s):  INR, PROTIME in the last 168 hours. ------------------------------------------------------------------------------------------------------------------- No results for input(s): DDIMER in the last 72 hours. -------------------------------------------------------------------------------------------------------------------  Cardiac Enzymes No results for input(s): CKMB, TROPONINI, MYOGLOBIN in the last 168 hours.  Invalid input(s): CK ------------------------------------------------------------------------------------------------------------------ No results found for: BNP   ---------------------------------------------------------------------------------------------------------------  Urinalysis    Component Value Date/Time   COLORURINE YELLOW 09/15/2016 0740   APPEARANCEUR CLEAR 09/15/2016 0740   LABSPEC 1.031 (H) 09/15/2016 0740   PHURINE 6.0 09/15/2016 0740   GLUCOSEU NEGATIVE 09/15/2016 0740   HGBUR NEGATIVE 09/15/2016 0740   BILIRUBINUR NEGATIVE 09/15/2016 0740   KETONESUR NEGATIVE 09/15/2016 0740   PROTEINUR NEGATIVE 09/15/2016 0740   UROBILINOGEN 1.0 07/10/2015 0115   NITRITE NEGATIVE 09/15/2016 0740   LEUKOCYTESUR NEGATIVE 09/15/2016 0740    ----------------------------------------------------------------------------------------------------------------   Imaging Results:     Dg Ribs Unilateral W/chest Right  Result Date: 09/15/2016 CLINICAL DATA:  Wheezing and shortness of breath. Right chest wall pain. Initial encounter. EXAM: RIGHT RIBS AND CHEST - 3+ VIEW COMPARISON:  04/21/2016 FINDINGS: No fracture or other bone lesions are seen involving the ribs. There is no evidence of pneumothorax or pleural effusion. Both lungs are clear. Heart size and mediastinal contours are within normal limits. IMPRESSION: Negative. Electronically Signed   By: Marnee Spring M.D.   On: 09/15/2016 09:00   Ct Abdomen Pelvis W Contrast  Result Date: 09/15/2016 CLINICAL DATA:  Right lower quadrant abdominal pain over the last several days, worse with palpation. EXAM: CT ABDOMEN AND PELVIS WITH CONTRAST TECHNIQUE: Multidetector CT imaging of the abdomen and pelvis was performed using the standard protocol following bolus administration of intravenous contrast. CONTRAST:  ISOVUE-300 IOPAMIDOL (ISOVUE-300) INJECTION 61% COMPARISON:  07/09/2015 FINDINGS: Lower chest: Minimal atelectasis at the left lung base. No pleural or pericardial fluid. Hepatobiliary: Liver is normal. Gallbladder does not contain any calcified stones. Pancreas: Normal Spleen: Normal Adrenals/Urinary Tract: Adrenal glands are normal. Kidneys are normal except for a nonobstructing 2 mm stone in the midportion of the right kidney. Stomach/Bowel: No bowel abnormality. Appendix is well seen as a normal structure, retrocecal. Vascular/Lymphatic: Normal Reproductive: Normal Other: No free fluid or air Musculoskeletal: Normal IMPRESSION: Normal appearing appendix. No other cause of abdominal pain identified. 2 mm nonobstructing stone in the right kidney, similar the previous study. Electronically Signed   By: Paulina Fusi M.D.   On: 09/15/2016 10:08       Assessment & Plan:    Active Problems:   Asthma attack   Abdominal pain    Asthma exacerbation - Patient with history of asthma in childhood, but no recent asthma  attack or hospitalization, this is most likely provoked by a viral illness, received diuresis with image roll underneath infiltrate, remains with diffuse wheezing and dyspnea, so he will be admitted , will start on IV insulin image 80 mg every 8 hours,DuoNeb every 6 hours, when necessary albuterol, I don't think there is any need for antibiotics as I do not think there is any bacterial infection.  Right flank pain - CT abdomen and pelvis with no acute findings, this is most likely related to muscle spasm from coughing, will apply warm compress, and muscle relaxant.   DVT Prophylaxis Heparin - SCD  AM Labs Ordered, also please review Full Orders  Family Communication: Admission, patients condition and plan of care including tests being ordered have been discussed with the patient  who indicate understanding and agree with the plan and Code Status.  Code Status Full  Likely DC to Home  Condition GUARDED    Consults called: none  Admission status: Observation  Time spent in minutes : 50 minutes   Feliz Herard M.D on 09/15/2016 at 1:19 PM  Between 7am to 7pm - Pager - [phone]. After 7pm go to [email or web address] - password [email or web address]  [facility] - Office  [phone]

## 2016-09-15 NOTE — ED Notes (Signed)
Report called to [facility]W.

## 2016-09-15 NOTE — ED Triage Notes (Signed)
Pt complains of right side pain, no injury, he states he's been coughing a lot and the more he does the worse his side hurts. Pt has auditory wheezing

## 2016-09-15 NOTE — ED Notes (Signed)
Patient resting on stretcher, stating he is sweating and feels like he has a fever.  Patient's oral temp 98.4 currently.  Patient's lung sounds with expiratory wheezing throughout and productive cough x4 days.  Patient is diaphoretic.  Patient also c/o RLQ pain.  Patient's abdomen is diffusely tender to palpation.  He states pain is worse with movement and cough.  Patient is alert and oriented and in no distress at shift change.

## 2016-09-15 NOTE — ED Notes (Signed)
Pulse ox stayed between 95%-99% while ambulating. I could hear patient wheezing. Heart rate stayed about 108

## 2016-09-15 NOTE — ED Provider Notes (Signed)
[facility]-EMERGENCY DEPT Provider Note   CSN: [id number] Arrival date & time: 09/15/16  4098     History   Chief Complaint Chief Complaint  Patient presents with  . Flank Pain    HPI Donald Fisher is a 25 y.o. male with a past medical history of asthma and bronchitis who presents emergency Department with chief complaint of wheezing and right flank pain. The patient states he has had 2 days of URI symptoms with progressively worsening wheezing, productive cough. He has not been running a fever but has been having chills and sweats along with body aches. He also complains of severe pain in the right side. He thought that it was initially just from coughing, but now states it is also painful at rest. The pain is certainly worse with coughing and once from the right rib cage, although a down to the right lower quadrant of the abdomen. He denies urinary symptoms, back pain, history of UTIs or kidney stones. He has had some nausea without vomiting or diarrhea. He denies constipation. Patient is a current daily smoker. HPI  Past Medical History:  Diagnosis Date  . Asthma    hx of   . Bronchitis   . GERD (gastroesophageal reflux disease)   . Heart murmur    hx of   . Pneumonia    hx of     Patient Active Problem List   Diagnosis Date Noted  . Asthma attack 09/15/2016  . Abdominal pain 09/15/2016    Past Surgical History:  Procedure Laterality Date  . SHOULDER ARTHROSCOPY WITH LABRAL REPAIR Right 07/21/2016   Procedure: RIGHT SHOULDER ARTHROSCOPY WITH LABRAL REPAIR;  Surgeon: Kathryne Hitch, MD;  Location: [facility];  Service: Orthopedics;  Laterality: Right;  Shoulder Block       Home Medications    Prior to Admission medications   Medication Sig Start Date End Date Taking? Authorizing Provider  Aspirin Effervescent (ALKA-SELTZER PO) Take 1 capsule by mouth daily.   Yes Historical Provider, MD  naproxen (NAPROSYN) 500 MG tablet Take 1 tablet (500 mg total) by mouth 2 (two)  times daily as needed (pain). Patient not taking: Reported on 09/15/2016 07/21/16   Kathryne Hitch, MD  oxyCODONE-acetaminophen (ROXICET) 5-325 MG tablet Take 1-2 tablets by mouth every 4 (four) hours as needed. Patient not taking: Reported on 09/15/2016 07/21/16   Kathryne Hitch, MD  tiZANidine (ZANAFLEX) 4 MG tablet Take 1 tablet (4 mg total) by mouth every 6 (six) hours as needed for muscle spasms. Patient not taking: Reported on 09/15/2016 07/21/16   Kathryne Hitch, MD    Family History Family History  Problem Relation Age of Onset  . Hypertension Mother   . Congestive Heart Failure Maternal Grandmother   . Hypertension Maternal Grandmother     Social History Social History  Substance Use Topics  . Smoking status: Current Every Day Smoker    Packs/day: 0.25    Years: 10.00    Types: Cigarettes  . Smokeless tobacco: Never Used  . Alcohol use Yes     Comment: occasionally     Allergies   Penicillins; Shellfish allergy; and Betadine [povidone iodine]   Review of Systems Review of Systems  Ten systems reviewed and are negative for acute change, except as noted in the HPI.   Physical Exam Updated Vital Signs BP 120/66   Pulse 120   Temp 98.4 F (36.9 C) (Oral)   Resp 18   SpO2 100%   Physical Exam  Constitutional: He appears well-developed and well-nourished. No distress.  HENT:  Head: Normocephalic and atraumatic.  Eyes: Conjunctivae are normal. No scleral icterus.  Neck: Normal range of motion. Neck supple.  Cardiovascular: Normal rate, regular rhythm and normal heart sounds.   Pulmonary/Chest: No respiratory distress. He has wheezes.  Productive cough Rattling rhonchi and diffuse inspiratory and expiratory wheezing throughout lung fields  Abdominal: Soft. Bowel sounds are normal. There is tenderness in the right upper quadrant and right lower quadrant. There is guarding.    Musculoskeletal: He exhibits no edema.  Neurological: He is  alert.  Skin: Skin is warm and dry. He is not diaphoretic.  Psychiatric: His behavior is normal.  Nursing note and vitals reviewed.    ED Treatments / Results  Labs (all labs ordered are listed, but only abnormal results are displayed) Labs Reviewed  URINALYSIS, ROUTINE W REFLEX MICROSCOPIC (NOT AT [facility]) - Abnormal; Notable for the following:       Result Value   Specific Gravity, Urine 1.031 (*)    All other components within normal limits  BASIC METABOLIC PANEL - Abnormal; Notable for the following:    Glucose, Bld 100 (*)    All other components within normal limits  HEPATIC FUNCTION PANEL - Abnormal; Notable for the following:    Bilirubin, Direct <0.1 (*)    All other components within normal limits  CBC  LIPASE, BLOOD    EKG  EKG Interpretation None       Radiology Dg Ribs Unilateral W/chest Right  Result Date: 09/15/2016 CLINICAL DATA:  Wheezing and shortness of breath. Right chest wall pain. Initial encounter. EXAM: RIGHT RIBS AND CHEST - 3+ VIEW COMPARISON:  04/21/2016 FINDINGS: No fracture or other bone lesions are seen involving the ribs. There is no evidence of pneumothorax or pleural effusion. Both lungs are clear. Heart size and mediastinal contours are within normal limits. IMPRESSION: Negative. Electronically Signed   By: Marnee SpringJonathon  Watts M.D.   On: 09/15/2016 09:00   Ct Abdomen Pelvis W Contrast  Result Date: 09/15/2016 CLINICAL DATA:  Right lower quadrant abdominal pain over the last several days, worse with palpation. EXAM: CT ABDOMEN AND PELVIS WITH CONTRAST TECHNIQUE: Multidetector CT imaging of the abdomen and pelvis was performed using the standard protocol following bolus administration of intravenous contrast. CONTRAST:  100mL ISOVUE-300 IOPAMIDOL (ISOVUE-300) INJECTION 61% COMPARISON:  07/09/2015 FINDINGS: Lower chest: Minimal atelectasis at the left lung base. No pleural or pericardial fluid. Hepatobiliary: Liver is normal. Gallbladder does not  contain any calcified stones. Pancreas: Normal Spleen: Normal Adrenals/Urinary Tract: Adrenal glands are normal. Kidneys are normal except for a nonobstructing 2 mm stone in the midportion of the right kidney. Stomach/Bowel: No bowel abnormality. Appendix is well seen as a normal structure, retrocecal. Vascular/Lymphatic: Normal Reproductive: Normal Other: No free fluid or air Musculoskeletal: Normal IMPRESSION: Normal appearing appendix. No other cause of abdominal pain identified. 2 mm nonobstructing stone in the right kidney, similar the previous study. Electronically Signed   By: Paulina FusiMark  Shogry M.D.   On: 09/15/2016 10:08    Procedures Procedures (including critical care time)  Medications Ordered in ED Medications  dextromethorphan-guaiFENesin (MUCINEX DM) 30-600 MG per 12 hr tablet 1 tablet (1 tablet Oral Given 09/15/16 0914)  albuterol (PROVENTIL,VENTOLIN) solution continuous neb (0 mg/hr Nebulization Stopped 09/15/16 1303)  albuterol (PROVENTIL,VENTOLIN) solution continuous neb (0 mg/hr Nebulization Stopped 09/15/16 1514)  ipratropium-albuterol (DUONEB) 0.5-2.5 (3) MG/3ML nebulizer solution 3 mL (3 mLs Nebulization Given 09/15/16 0748)  methylPREDNISolone sodium succinate (SOLU-MEDROL)  125 mg/2 mL injection 125 mg (125 mg Intravenous Given 09/15/16 0839)  sodium chloride 0.9 % bolus 1,000 mL (0 mLs Intravenous Stopped 09/15/16 1303)  morphine 4 MG/ML injection 4 mg (4 mg Intravenous Given 09/15/16 0839)  ondansetron (ZOFRAN) injection 4 mg (4 mg Intravenous Given 09/15/16 0839)  ipratropium-albuterol (DUONEB) 0.5-2.5 (3) MG/3ML nebulizer solution 3 mL (3 mLs Nebulization Given 09/15/16 0844)  iopamidol (ISOVUE-300) 61 % injection 100 mL (100 mLs Intravenous Contrast Given 09/15/16 0953)  magnesium sulfate IVPB 2 g 50 mL (0 g Intravenous Stopped 09/15/16 1416)     Initial Impression / Assessment and Plan / ED Course  I have reviewed the triage vital signs and the nursing  notes.  Pertinent labs & imaging results that were available during my care of the patient were reviewed by me and considered in my medical decision making (see chart for details).  Clinical Course    patient imaging negative. Cough improved with Dextromethorphan and guaifenesin tablet. He still having significant difficulty breathing after steroids, 2 DuoNeb's and 1 hour-long nebulizer treatment.  Patient still feels very short of breath. He has tachypnea at rest. I have ordered another hour-long neb treatment and IV magnesium. Dr.Elgargawy will admit the patient for asthma exacerbation secondary to chronic bronchitis. He is stable throughout visit. I doubt any other cause of SOB such as PE.   Final Clinical Impressions(s) / ED Diagnoses   Final diagnoses:  Acute asthma    New Prescriptions New Prescriptions   No medications on file     Arthor Captain, PA-C 09/15/16 1525    Benjiman Core, MD 09/18/16 [phone]

## 2016-09-16 LAB — BASIC METABOLIC PANEL
Anion gap: 9 (ref 5–15)
BUN: 13 mg/dL (ref 6–20)
CALCIUM: 9.1 mg/dL (ref 8.9–10.3)
CO2: 21 mmol/L — ABNORMAL LOW (ref 22–32)
Chloride: 109 mmol/L (ref 101–111)
Creatinine, Ser: 1.02 mg/dL (ref 0.61–1.24)
GFR calc Af Amer: >60 mL/min (ref >60)
GLUCOSE: 183 mg/dL — AB (ref 65–99)
Potassium: 4.4 mmol/L (ref 3.5–5.1)
Sodium: 139 mmol/L (ref 135–145)

## 2016-09-16 MED ORDER — TRAMADOL HCL 50 MG PO TABS
50.0000 mg | ORAL_TABLET | Freq: Four times a day (QID) | ORAL | Status: DC | PRN
  Administered 2016-09-16: 50 mg via ORAL
  Filled 2016-09-16: qty 1

## 2016-09-16 MED ORDER — METHYLPREDNISOLONE SODIUM SUCC 125 MG IJ SOLR
60.0000 mg | Freq: Three times a day (TID) | INTRAMUSCULAR | Status: DC
  Administered 2016-09-16 – 2016-09-17 (×3): 60 mg via INTRAVENOUS
  Filled 2016-09-16 (×3): qty 2

## 2016-09-16 NOTE — Progress Notes (Signed)
PROGRESS NOTE    Donald Fisher  ZOX:[id number] DOB: July 03, 1991 DOA: 09/15/2016 PCP: No PCP Per Patient   Brief Narrative: Donald Fisher  is a 25 y.o. male, With past medical history of childhood asthma, presents with asthma exacerbation and flank pain.   Assessment & Plan:   Active Problems:   Asthma attack   Abdominal pain   Acute asthma exacerbation:  Started on IV fluids,  Slight improvement in breathing, wheezing better.  Resume duo nebs.    Right flank pain:  Improved.  Pain control with tramadol.       DVT prophylaxis: (/Heparin/) Code Status: (Full/) Family Communication: none at bedside.  Disposition Plan: pending further evaluation.    Consultants:   None.    Procedures: none.    Antimicrobials: none   Subjective: Reports worsening cramping.   Objective: Vitals:   09/15/16 2015 09/16/16 0208 09/16/16 0558 09/16/16 0736  BP: 140/65  125/68   Pulse: (!) 108  90   Resp: 18  16   Temp: 97.6 F (36.4 C)  97.7 F (36.5 C)   TempSrc: Oral  Oral   SpO2: 98% 98% 98% 98%    Intake/Output Summary (Last 24 hours) at 09/16/16 1415 Last data filed at 09/16/16 0320  Gross per 24 hour  Intake           778.33 ml  Output                0 ml  Net           778.33 ml   There were no vitals filed for this visit.  Examination:  General exam: Appears calm and comfortable  Respiratory system: bilateral wheezing.  Cardiovascular system: S1 & S2 heard, RRR. No JVD, murmurs, rubs, gallops or clicks. No pedal edema. Gastrointestinal system: Abdomen is nondistended, soft and nontender. No organomegaly or masses felt. Normal bowel sounds heard. Central nervous system: Alert and oriented. No focal neurological deficits. Extremities: Symmetric 5 x 5 power. Skin: No rashes, lesions or ulcers Psychiatry: Judgement and insight appear normal. Mood & affect appropriate.     Data Reviewed: I have personally reviewed following labs and imaging  studies  CBC:  Recent Labs Lab 09/15/16 0748  WBC 9.7  HGB 16.2  HCT 48.1  MCV 92.1  PLT 178   Basic Metabolic Panel:  Recent Labs Lab 09/15/16 0748 09/16/16 1000  NA 139 139  K 3.9 4.4  CL 105 109  CO2 27 21*  GLUCOSE 100* 183*  BUN 14 13  CREATININE 1.10 1.02  CALCIUM 9.0 9.1   GFR: CrCl cannot be calculated (Unknown ideal weight.). Liver Function Tests:  Recent Labs Lab 09/15/16 0748  AST 39  ALT 57  ALKPHOS 61  BILITOT 1.1  PROT 7.4  ALBUMIN 4.2    Recent Labs Lab 09/15/16 0748  LIPASE 21   No results for input(s): AMMONIA in the last 168 hours. Coagulation Profile: No results for input(s): INR, PROTIME in the last 168 hours. Cardiac Enzymes: No results for input(s): CKTOTAL, CKMB, CKMBINDEX, TROPONINI in the last 168 hours. BNP (last 3 results) No results for input(s): PROBNP in the last 8760 hours. HbA1C: No results for input(s): HGBA1C in the last 72 hours. CBG: No results for input(s): GLUCAP in the last 168 hours. Lipid Profile: No results for input(s): CHOL, HDL, LDLCALC, TRIG, CHOLHDL, LDLDIRECT in the last 72 hours. Thyroid Function Tests: No results for input(s): TSH, T4TOTAL, FREET4, T3FREE, THYROIDAB in the last 72  hours. Anemia Panel: No results for input(s): VITAMINB12, FOLATE, FERRITIN, TIBC, IRON, RETICCTPCT in the last 72 hours. Sepsis Labs: No results for input(s): PROCALCITON, LATICACIDVEN in the last 168 hours.  No results found for this or any previous visit (from the past 240 hour(s)).       Radiology Studies: Dg Ribs Unilateral W/chest Right  Result Date: 09/15/2016 CLINICAL DATA:  Wheezing and shortness of breath. Right chest wall pain. Initial encounter. EXAM: RIGHT RIBS AND CHEST - 3+ VIEW COMPARISON:  04/21/2016 FINDINGS: No fracture or other bone lesions are seen involving the ribs. There is no evidence of pneumothorax or pleural effusion. Both lungs are clear. Heart size and mediastinal contours are within  normal limits. IMPRESSION: Negative. Electronically Signed   By: Marnee SpringJonathon  Watts M.D.   On: 09/15/2016 09:00   Ct Abdomen Pelvis W Contrast  Result Date: 09/15/2016 CLINICAL DATA:  Right lower quadrant abdominal pain over the last several days, worse with palpation. EXAM: CT ABDOMEN AND PELVIS WITH CONTRAST TECHNIQUE: Multidetector CT imaging of the abdomen and pelvis was performed using the standard protocol following bolus administration of intravenous contrast. CONTRAST:  100mL ISOVUE-300 IOPAMIDOL (ISOVUE-300) INJECTION 61% COMPARISON:  07/09/2015 FINDINGS: Lower chest: Minimal atelectasis at the left lung base. No pleural or pericardial fluid. Hepatobiliary: Liver is normal. Gallbladder does not contain any calcified stones. Pancreas: Normal Spleen: Normal Adrenals/Urinary Tract: Adrenal glands are normal. Kidneys are normal except for a nonobstructing 2 mm stone in the midportion of the right kidney. Stomach/Bowel: No bowel abnormality. Appendix is well seen as a normal structure, retrocecal. Vascular/Lymphatic: Normal Reproductive: Normal Other: No free fluid or air Musculoskeletal: Normal IMPRESSION: Normal appearing appendix. No other cause of abdominal pain identified. 2 mm nonobstructing stone in the right kidney, similar the previous study. Electronically Signed   By: Paulina FusiMark  Shogry M.D.   On: 09/15/2016 10:08        Scheduled Meds: . dextromethorphan-guaiFENesin  1 tablet Oral BID  . heparin  5,000 Units Subcutaneous Q8H  . ipratropium-albuterol  3 mL Nebulization Q6H  . methylPREDNISolone (SOLU-MEDROL) injection  80 mg Intravenous Q8H  . pantoprazole  40 mg Oral Daily   Continuous Infusions:    LOS: 0 days    Time spent: 25 minutes/     Lynsee Wands, MD [facility] Pager [phone]   If 7PM-7AM, please contact night-coverage [email or web address] Password [email or web address] 09/16/2016, 2:15 PM

## 2016-09-17 MED ORDER — DOXYCYCLINE HYCLATE 100 MG PO TABS
100.0000 mg | ORAL_TABLET | Freq: Two times a day (BID) | ORAL | Status: DC
  Administered 2016-09-17 – 2016-09-18 (×3): 100 mg via ORAL
  Filled 2016-09-17 (×3): qty 1

## 2016-09-17 MED ORDER — METHYLPREDNISOLONE SODIUM SUCC 40 MG IJ SOLR
40.0000 mg | Freq: Three times a day (TID) | INTRAMUSCULAR | Status: DC
  Administered 2016-09-17 – 2016-09-18 (×2): 40 mg via INTRAVENOUS
  Filled 2016-09-17 (×2): qty 1

## 2016-09-17 NOTE — Progress Notes (Signed)
PROGRESS NOTE    [name]  ZOX:[id number] DOB: May 27, 1991 DOA: 09/15/2016 PCP: No PCP Per Patient   Brief Narrative: Donald Fisher  is a 25 y.o. male, With past medical history of childhood asthma, presents with asthma exacerbation and flank pain.   Assessment & Plan:   Active Problems:   Asthma attack   Abdominal pain   Acute asthma exacerbation:  Started on IV solumedrol, tapering today as his wheezing improved. But pt reports he is sob on walking a few steps to the bathroom and trouble breathing while talking.   Would recommend another day of IV solumedrol, transition to po in am, .  Add doxycycline for bronchitis.  Recommend checking ambulating oxygen levels.  Resume albuterol nebs as needed.  Resume mucinex for cough.    Right flank pain:  Improved.  Pain control with tramadol.  Resolved.       DVT prophylaxis: (/Heparin/) Code Status: (Full/) Family Communication: none at bedside.  Disposition Plan: pending further evaluation.    Consultants:   None.    Procedures: none.    Antimicrobials: none   Subjective: Reports sob on ambulation and talking.   Objective: Vitals:   09/16/16 1535 09/16/16 1922 09/16/16 2134 09/17/16 0601  BP: 129/79  136/61 132/62  Pulse: 91  83 75  Resp: 16  17 17   Temp: 98.9 F (37.2 C)  98.2 F (36.8 C) 97.6 F (36.4 C)  TempSrc: Oral  Oral Oral  SpO2: 98% 98% 96% 100%    Intake/Output Summary (Last 24 hours) at 09/17/16 1432 Last data filed at 09/16/16 2300  Gross per 24 hour  Intake              480 ml  Output                0 ml  Net              480 ml   There were no vitals filed for this visit.  Examination:  General exam: Appears calm and comfortable  Respiratory system: bilateral wheezing.  Cardiovascular system: S1 & S2 heard, RRR. No JVD, murmurs, rubs, gallops or clicks. No pedal edema. Gastrointestinal system: Abdomen is nondistended, soft and nontender. No organomegaly or masses felt.  Normal bowel sounds heard. Central nervous system: Alert and oriented. No focal neurological deficits. Extremities: Symmetric 5 x 5 power. Skin: No rashes, lesions or ulcers Psychiatry: Judgement and insight appear normal. Mood & affect appropriate.     Data Reviewed: I have personally reviewed following labs and imaging studies  CBC:  Recent Labs Lab 09/15/16 0748  WBC 9.7  HGB 16.2  HCT 48.1  MCV 92.1  PLT 178   Basic Metabolic Panel:  Recent Labs Lab 09/15/16 0748 09/16/16 1000  NA 139 139  K 3.9 4.4  CL 105 109  CO2 27 21*  GLUCOSE 100* 183*  BUN 14 13  CREATININE 1.10 1.02  CALCIUM 9.0 9.1   GFR: CrCl cannot be calculated (Unknown ideal weight.). Liver Function Tests:  Recent Labs Lab 09/15/16 0748  AST 39  ALT 57  ALKPHOS 61  BILITOT 1.1  PROT 7.4  ALBUMIN 4.2    Recent Labs Lab 09/15/16 0748  LIPASE 21   No results for input(s): AMMONIA in the last 168 hours. Coagulation Profile: No results for input(s): INR, PROTIME in the last 168 hours. Cardiac Enzymes: No results for input(s): CKTOTAL, CKMB, CKMBINDEX, TROPONINI in the last 168 hours. BNP (last 3 results) No  results for input(s): PROBNP in the last 8760 hours. HbA1C: No results for input(s): HGBA1C in the last 72 hours. CBG: No results for input(s): GLUCAP in the last 168 hours. Lipid Profile: No results for input(s): CHOL, HDL, LDLCALC, TRIG, CHOLHDL, LDLDIRECT in the last 72 hours. Thyroid Function Tests: No results for input(s): TSH, T4TOTAL, FREET4, T3FREE, THYROIDAB in the last 72 hours. Anemia Panel: No results for input(s): VITAMINB12, FOLATE, FERRITIN, TIBC, IRON, RETICCTPCT in the last 72 hours. Sepsis Labs: No results for input(s): PROCALCITON, LATICACIDVEN in the last 168 hours.  No results found for this or any previous visit (from the past 240 hour(s)).       Radiology Studies: No results found.      Scheduled Meds: . dextromethorphan-guaiFENesin  1  tablet Oral BID  . heparin  5,000 Units Subcutaneous Q8H  . ipratropium-albuterol  3 mL Nebulization Q6H  . methylPREDNISolone (SOLU-MEDROL) injection  60 mg Intravenous Q8H  . pantoprazole  40 mg Oral Daily   Continuous Infusions:    LOS: 1 day    Time spent: 25 minutes/     Jackie Russman, MD [facility] Pager [phone]   If 7PM-7AM, please contact night-coverage [email or web address] Password [email or web address] 09/17/2016, 2:32 PM

## 2016-09-18 MED ORDER — ALBUTEROL SULFATE HFA 108 (90 BASE) MCG/ACT IN AERS: 1.0000 | INHALATION_SPRAY | RESPIRATORY_TRACT | Status: DC | PRN

## 2016-09-18 MED ORDER — TRAMADOL HCL 50 MG PO TABS: 50.0000 mg | ORAL_TABLET | Freq: Four times a day (QID) | ORAL | 0 refills | Status: DC | PRN

## 2016-09-18 MED ORDER — ALBUTEROL SULFATE HFA 108 (90 BASE) MCG/ACT IN AERS: 2.0000 | INHALATION_SPRAY | Freq: Four times a day (QID) | RESPIRATORY_TRACT | 0 refills | Status: DC | PRN

## 2016-09-18 MED ORDER — ALBUTEROL SULFATE (2.5 MG/3ML) 0.083% IN NEBU: 2.5000 mg | INHALATION_SOLUTION | RESPIRATORY_TRACT | 12 refills | Status: DC | PRN

## 2016-09-18 MED ORDER — IPRATROPIUM-ALBUTEROL 0.5-2.5 (3) MG/3ML IN SOLN: 3.0000 mL | Freq: Three times a day (TID) | RESPIRATORY_TRACT | Status: DC

## 2016-09-18 MED ORDER — LEVOFLOXACIN 500 MG PO TABS: 500.0000 mg | ORAL_TABLET | Freq: Every day | ORAL | 0 refills | Status: DC

## 2016-09-18 MED ORDER — PREDNISONE 20 MG PO TABS: 40.0000 mg | ORAL_TABLET | Freq: Every day | ORAL | 0 refills | Status: DC

## 2016-09-18 MED ORDER — DM-GUAIFENESIN ER 30-600 MG PO TB12: 1.0000 | ORAL_TABLET | Freq: Two times a day (BID) | ORAL | 0 refills | Status: DC

## 2016-09-18 MED ORDER — PANTOPRAZOLE SODIUM 40 MG PO TBEC: 40.0000 mg | DELAYED_RELEASE_TABLET | Freq: Every day | ORAL | 0 refills | Status: DC

## 2016-09-18 MED FILL — levoFLOXacin 500 MG TABS: 500 | 5 days supply | Qty: 5 | Fill #0

## 2016-09-18 MED FILL — PANTOPRAZOLE SOD DR 40 MG T: 40 | 14 days supply | Qty: 14 | Fill #0

## 2016-09-18 MED FILL — predniSONE 20 MG TABS: 20 | 7 days supply | Qty: 14 | Fill #0

## 2016-09-18 MED FILL — VENTOLIN HFA 90 MCG INHALER: 108 (90 BAS | 25 days supply | Qty: 18 | Fill #0

## 2016-09-18 MED FILL — ALBUTEROL 0.083 MG/ML SOLN: (2.5 MG/3ML | 3 days supply | Qty: 90 | Fill #0

## 2016-09-18 MED FILL — traMADol HCL 50 MG TABS: 50 | 2 days supply | Qty: 10 | Fill #0

## 2016-09-18 NOTE — Discharge Summary (Signed)
Physician Discharge Summary  Donald Fisher:[id number] DOB: 1991-11-30 DOA: 09/15/2016  PCP: No PCP Per Patient  Admit date: 09/15/2016 Discharge date: 09/18/2016  Admitted From: Home.  Disposition:  HOme.   Recommendations for Outpatient Follow-up:  1. Follow up with PCP in 1-2 weeks 2. Please obtain BMP/CBC in one week 3. Please follow up on the following pending results:    Discharge Condition:stable.  CODE STATUS:full code.  Diet recommendation: regular.   Brief/Interim Summary: Donald Fisher a 25 y.o.male,With past medical history of childhood asthma, presents with asthma exacerbation and flank pain.  Discharge Diagnoses:  Active Problems:   Asthma attack   Abdominal pain Acute asthma exacerbation:  Started on IV solumedrol, tapering today to po prednisone as his wheezing improved.Added levaquin  for bronchitis.  Resume albuterol nebs as needed.  Resume mucinex for cough on discharge. He will need neb machine and PCP appointment for discharge.    Right flank pain:  Improved.  Pain control with tramadol.  Resolved.   Discharge Instructions  Discharge Instructions    Discharge instructions    Complete by:  As directed    Follow up with PCP in one week.       Medication List    STOP taking these medications   naproxen 500 MG tablet Commonly known as:  NAPROSYN   oxyCODONE-acetaminophen 5-325 MG tablet Commonly known as:  ROXICET   tiZANidine 4 MG tablet Commonly known as:  ZANAFLEX     TAKE these medications   albuterol 108 (90 Base) MCG/ACT inhaler Commonly known as:  PROVENTIL HFA;VENTOLIN HFA Inhale 2 puffs into the lungs every 6 (six) hours as needed for wheezing or shortness of breath.   albuterol (2.5 MG/3ML) 0.083% nebulizer solution Commonly known as:  PROVENTIL Take 3 mLs (2.5 mg total) by nebulization every 2 (two) hours as needed for wheezing or shortness of breath.   ALKA-SELTZER PO Take 1 capsule by mouth daily.    dextromethorphan-guaiFENesin 30-600 MG 12hr tablet Commonly known as:  MUCINEX DM Take 1 tablet by mouth 2 (two) times daily.   levofloxacin 500 MG tablet Commonly known as:  LEVAQUIN Take 1 tablet (500 mg total) by mouth daily.   pantoprazole 40 MG tablet Commonly known as:  PROTONIX Take 1 tablet (40 mg total) by mouth daily. Start taking on:  09/19/2016   predniSONE 20 MG tablet Commonly known as:  DELTASONE Take 2 tablets (40 mg total) by mouth daily.   traMADol 50 MG tablet Commonly known as:  ULTRAM Take 1 tablet (50 mg total) by mouth every 6 (six) hours as needed for moderate pain.       Allergies  Allergen Reactions  . Penicillins Hives    Has patient had a PCN reaction causing immediate rash, facial/tongue/throat swelling, SOB or lightheadedness with hypotension: No Has patient had a PCN reaction causing severe rash involving mucus membranes or skin necrosis: No Has patient had a PCN reaction that required hospitalization: No Has patient had a PCN reaction occurring within the last 10 years: Yes If all of the above answers are "NO", then may proceed with Cephalosporin use.   . Shellfish Allergy Swelling    Oysters  . Betadine [Povidone Iodine] Rash    As a child    Consultations: None.   Procedures/Studies: Dg Ribs Unilateral W/chest Right  Result Date: 09/15/2016 CLINICAL DATA:  Wheezing and shortness of breath. Right chest wall pain. Initial encounter. EXAM: RIGHT RIBS AND CHEST - 3+ VIEW COMPARISON:  04/21/2016 FINDINGS:  No fracture or other bone lesions are seen involving the ribs. There is no evidence of pneumothorax or pleural effusion. Both lungs are clear. Heart size and mediastinal contours are within normal limits. IMPRESSION: Negative. Electronically Signed   By: Marnee SpringJonathon  Watts M.D.   On: 09/15/2016 09:00   Ct Abdomen Pelvis W Contrast  Result Date: 09/15/2016 CLINICAL DATA:  Right lower quadrant abdominal pain over the last several days,  worse with palpation. EXAM: CT ABDOMEN AND PELVIS WITH CONTRAST TECHNIQUE: Multidetector CT imaging of the abdomen and pelvis was performed using the standard protocol following bolus administration of intravenous contrast. CONTRAST:  100mL ISOVUE-300 IOPAMIDOL (ISOVUE-300) INJECTION 61% COMPARISON:  07/09/2015 FINDINGS: Lower chest: Minimal atelectasis at the left lung base. No pleural or pericardial fluid. Hepatobiliary: Liver is normal. Gallbladder does not contain any calcified stones. Pancreas: Normal Spleen: Normal Adrenals/Urinary Tract: Adrenal glands are normal. Kidneys are normal except for a nonobstructing 2 mm stone in the midportion of the right kidney. Stomach/Bowel: No bowel abnormality. Appendix is well seen as a normal structure, retrocecal. Vascular/Lymphatic: Normal Reproductive: Normal Other: No free fluid or air Musculoskeletal: Normal IMPRESSION: Normal appearing appendix. No other cause of abdominal pain identified. 2 mm nonobstructing stone in the right kidney, similar the previous study. Electronically Signed   By: Paulina FusiMark  Shogry M.D.   On: 09/15/2016 10:08       Subjective: Reports feeling a little better than yesterday.   Discharge Exam: Vitals:   09/18/16 0510 09/18/16 0845  BP: 125/68   Pulse: (!) 59 90  Resp:  18  Temp: 97.8 F (36.6 C)    Vitals:   09/17/16 2135 09/18/16 0250 09/18/16 0510 09/18/16 0845  BP: (!) 150/82  125/68   Pulse: 97  (!) 59 90  Resp: 17   18  Temp: 98.1 F (36.7 C)  97.8 F (36.6 C)   TempSrc: Oral  Oral   SpO2: 94% 95% 98% 98%    General: Pt is alert, awake, not in acute distress Cardiovascular: RRR, S1/S2 +, no rubs, no gallops Respiratory: scattered wheezing  Bilaterally,. Abdominal: Soft, NT, ND, bowel sounds + Extremities: no edema, no cyanosis    The results of significant diagnostics from this hospitalization (including imaging, microbiology, ancillary and laboratory) are listed below for reference.      Microbiology: No results found for this or any previous visit (from the past 240 hour(s)).   Labs: BNP (last 3 results) No results for input(s): BNP in the last 8760 hours. Basic Metabolic Panel:  Recent Labs Lab 09/15/16 0748 09/16/16 1000  NA 139 139  K 3.9 4.4  CL 105 109  CO2 27 21*  GLUCOSE 100* 183*  BUN 14 13  CREATININE 1.10 1.02  CALCIUM 9.0 9.1   Liver Function Tests:  Recent Labs Lab 09/15/16 0748  AST 39  ALT 57  ALKPHOS 61  BILITOT 1.1  PROT 7.4  ALBUMIN 4.2    Recent Labs Lab 09/15/16 0748  LIPASE 21   No results for input(s): AMMONIA in the last 168 hours. CBC:  Recent Labs Lab 09/15/16 0748  WBC 9.7  HGB 16.2  HCT 48.1  MCV 92.1  PLT 178   Cardiac Enzymes: No results for input(s): CKTOTAL, CKMB, CKMBINDEX, TROPONINI in the last 168 hours. BNP: Invalid input(s): POCBNP CBG: No results for input(s): GLUCAP in the last 168 hours. D-Dimer No results for input(s): DDIMER in the last 72 hours. Hgb A1c No results for input(s): HGBA1C in the last 72  hours. Lipid Profile No results for input(s): CHOL, HDL, LDLCALC, TRIG, CHOLHDL, LDLDIRECT in the last 72 hours. Thyroid function studies No results for input(s): TSH, T4TOTAL, T3FREE, THYROIDAB in the last 72 hours.  Invalid input(s): FREET3 Anemia work up No results for input(s): VITAMINB12, FOLATE, FERRITIN, TIBC, IRON, RETICCTPCT in the last 72 hours. Urinalysis    Component Value Date/Time   COLORURINE YELLOW 09/15/2016 0740   APPEARANCEUR CLEAR 09/15/2016 0740   LABSPEC 1.031 (H) 09/15/2016 0740   PHURINE 6.0 09/15/2016 0740   GLUCOSEU NEGATIVE 09/15/2016 0740   HGBUR NEGATIVE 09/15/2016 0740   BILIRUBINUR NEGATIVE 09/15/2016 0740   KETONESUR NEGATIVE 09/15/2016 0740   PROTEINUR NEGATIVE 09/15/2016 0740   UROBILINOGEN 1.0 07/10/2015 0115   NITRITE NEGATIVE 09/15/2016 0740   LEUKOCYTESUR NEGATIVE 09/15/2016 0740   Sepsis Labs Invalid input(s): PROCALCITONIN,  WBC,   LACTICIDVEN Microbiology No results found for this or any previous visit (from the past 240 hour(s)).   Time coordinating discharge: Over 30 minutes  SIGNED:   Kathlen Mody, MD  [facility] 09/18/2016, 11:07 AM Pager   If 7PM-7AM, please contact night-coverage [email or web address] Password [email or web address]

## 2016-09-18 NOTE — Progress Notes (Signed)
Patient discharged to home, all discharge medications and instructions reviewed and questions answered.  Patient to be assisted to vehicle when ride arrives.

## 2016-09-18 NOTE — Progress Notes (Signed)
Patient reassessed per RT protocol; patient scheduled tx changed to TID. Patient is in do distress. BBS rhonchi with exp wheezing (patient forcefully exhaling). Patient is able to communicate via telephone without problem of wheezing or SOB. Patient is in no distress and in bed resting comfortably. RT would recommend patient getting in chair today. RT will continue to monitor patient.

## 2016-09-18 NOTE — Progress Notes (Signed)
Pt is without insurance and is not working. Pt given [facility] packet and he was instructed to call Wednesday and make a f/u appointment. This CM attempted to call and make an appointment for him and was unsuccessful. Pt was also provided with a MATCH letter and given a list [facility] Pt needing a neb machine for home. [facility] DME rep contacted for neb. No other DC needs communicated. Sandford Crazeora Hosteen Kienast RN,BSN,NCM [phone]

## 2016-09-25 ENCOUNTER — Ambulatory Visit (INDEPENDENT_AMBULATORY_CARE_PROVIDER_SITE_OTHER): Payer: PRIVATE HEALTH INSURANCE | Admitting: Physician Assistant

## 2016-09-25 DIAGNOSIS — Z9889 Other specified postprocedural states: Secondary | ICD-10-CM

## 2016-09-25 NOTE — Progress Notes (Signed)
Office Visit Note   Patient: Donald Fisher           Date of Birth: 1991/05/04           MRN: [id number] Visit Date: 09/25/2016              Requested by: No referring provider defined for this encounter. PCP: No PCP Per Patient   Assessment & Plan: Visit Diagnoses: No diagnosis found.  Plan: Follow up in 4 weeks.   Follow-Up Instructions: No Follow-up on file.   Orders:  No orders of the defined types were placed in this encounter.  Meds ordered this encounter  Medications  . HYDROcodone-acetaminophen (NORCO/VICODIN) 5-325 MG tablet    Sig: Take 5-325 tablets by mouth every 6 (six) hours as needed. for pain    Refill:  0      Procedures: No procedures performed   Clinical Data: No additional findings.   Subjective: Chief Complaint  Patient presents with  . Right Shoulder - Follow-up    07/24/16  Right Shoulder Arthroscopy.Patient has Dc'd sling.  Has aching off and on. Still limited ROM when lifting arm overhead.  Ready to go back to work.    Return today for follow up right shoulder 9 weeks s/p arthroscopic labral repair. Denies any pain right shoulder. Only some tenderness. Not taking any pain medications. 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     Review of Systems  Respiratory: Positive for wheezing.   Musculoskeletal:       Right shoulder tenderness and decreased range of motion     Objective: Vital Signs: There were no vitals taken for this visit.  Physical Exam  Right Shoulder Exam   Tenderness  The patient is experiencing no tenderness.    Range of Motion  Active Abduction: normal  Passive Abduction: normal  Forward Flexion: 160  External Rotation: 70   Muscle Strength  Internal Rotation: 5/5  External Rotation: 5/5   Tests  Apprehension: negative  Other  Pulse: present  Comments:  Port sites all well healed.      Specialty Comments:  No specialty comments available.  Imaging: Dg Ribs Unilateral W/chest  Right  Result Date: 09/15/2016 CLINICAL DATA:  Wheezing and shortness of breath. Right chest wall pain. Initial encounter. EXAM: RIGHT RIBS AND CHEST - 3+ VIEW COMPARISON:  04/21/2016 FINDINGS: No fracture or other bone lesions are seen involving the ribs. There is no evidence of pneumothorax or pleural effusion. Both lungs are clear. Heart size and mediastinal contours are within normal limits. IMPRESSION: Negative. Electronically Signed   By: Marnee SpringJonathon  Watts M.D.   On: 09/15/2016 09:00   Ct Abdomen Pelvis W Contrast  Result Date: 09/15/2016 CLINICAL DATA:  Right lower quadrant abdominal pain over the last several days, worse with palpation. EXAM: CT ABDOMEN AND PELVIS WITH CONTRAST TECHNIQUE: Multidetector CT imaging of the abdomen and pelvis was performed using the standard protocol following bolus administration of intravenous contrast. CONTRAST:  100mL ISOVUE-300 IOPAMIDOL (ISOVUE-300) INJECTION 61% COMPARISON:  07/09/2015 FINDINGS: Lower chest: Minimal atelectasis at the left lung base. No pleural or pericardial fluid. Hepatobiliary: Liver is normal. Gallbladder does not contain any calcified stones. Pancreas: Normal Spleen: Normal Adrenals/Urinary Tract: Adrenal glands are normal. Kidneys are normal except for a nonobstructing 2 mm stone in the midportion of the right kidney. Stomach/Bowel: No bowel abnormality. Appendix is well seen as a normal structure, retrocecal. Vascular/Lymphatic: Normal Reproductive: Normal Other: No free fluid or air Musculoskeletal: Normal  IMPRESSION: Normal appearing appendix. No other cause of abdominal pain identified. 2 mm nonobstructing stone in the right kidney, similar the previous study. Electronically Signed   By: Paulina Fusi M.D.   On: 09/15/2016 10:08     PMFS History: Patient Active Problem List   Diagnosis Date Noted  . Asthma attack 09/15/2016  . Abdominal pain 09/15/2016   Past Medical History:  Diagnosis Date  . Asthma    hx of   . Bronchitis     . GERD (gastroesophageal reflux disease)   . Heart murmur    hx of   . Pneumonia    hx of     Family History  Problem Relation Age of Onset  . Hypertension Mother   . Congestive Heart Failure Maternal Grandmother   . Hypertension Maternal Grandmother     Past Surgical History:  Procedure Laterality Date  . SHOULDER ARTHROSCOPY WITH LABRAL REPAIR Right 07/21/2016   Procedure: RIGHT SHOULDER ARTHROSCOPY WITH LABRAL REPAIR;  Surgeon: Kathryne Hitch, MD;  Location: [facility];  Service: Orthopedics;  Laterality: Right;  Shoulder Block   Social History   Occupational History  . Not on file.   Social History Main Topics  . Smoking status: Current Every Day Smoker    Packs/day: 0.25    Years: 10.00    Types: Cigarettes  . Smokeless tobacco: Never Used  . Alcohol use Yes     Comment: occasionally  . Drug use: No  . Sexual activity: No

## 2016-09-25 NOTE — Patient Instructions (Signed)
No lifting greater than 15 pounds right arm. No lifting over head and limited external rotation. Start physical therapy to rehab shoulder.

## 2016-10-23 ENCOUNTER — Ambulatory Visit (INDEPENDENT_AMBULATORY_CARE_PROVIDER_SITE_OTHER): Payer: PRIVATE HEALTH INSURANCE | Admitting: Physician Assistant

## 2016-12-08 ENCOUNTER — Encounter (HOSPITAL_COMMUNITY): Payer: Self-pay | Admitting: Emergency Medicine

## 2016-12-08 ENCOUNTER — Emergency Department (HOSPITAL_COMMUNITY)
Admission: EM | Admit: 2016-12-08 | Discharge: 2016-12-08 | Disposition: A | Discharge status: Home / Self Care | Payer: PRIVATE HEALTH INSURANCE | Attending: Emergency Medicine | Admitting: Emergency Medicine

## 2016-12-08 DIAGNOSIS — L0390 Cellulitis, unspecified: Secondary | ICD-10-CM

## 2016-12-08 DIAGNOSIS — J45909 Unspecified asthma, uncomplicated: Secondary | ICD-10-CM | POA: Insufficient documentation

## 2016-12-08 DIAGNOSIS — L03116 Cellulitis of left lower limb: Secondary | ICD-10-CM | POA: Insufficient documentation

## 2016-12-08 DIAGNOSIS — L03115 Cellulitis of right lower limb: Secondary | ICD-10-CM | POA: Insufficient documentation

## 2016-12-08 DIAGNOSIS — F17210 Nicotine dependence, cigarettes, uncomplicated: Secondary | ICD-10-CM | POA: Insufficient documentation

## 2016-12-08 NOTE — ED Provider Notes (Signed)
[facility]-EMERGENCY DEPT Provider Note   CSN: [id number] Arrival date & time: 12/08/16  1753     History   Chief Complaint Chief Complaint  Patient presents with  . Abscess    HPI Donald Fisher is a 26 y.o. male who presents for evaluation of 2 abscesses. PMH significant for previous abscess requiring I&D. He states he has had the abscess on his right upper thigh for a week. It started draining yesterday. He also noted an abscess on the left upper thigh develop about 2 days ago. It is not draining. He has tried warm compresses and 1 Ibuprofen for pain. Denies fever or chills.  HPI  Past Medical History:  Diagnosis Date  . Asthma    hx of   . Bronchitis   . GERD (gastroesophageal reflux disease)   . Heart murmur    hx of   . Pneumonia    hx of     Patient Active Problem List   Diagnosis Date Noted  . Status post labral repair of shoulder 09/25/2016  . Asthma attack 09/15/2016  . Abdominal pain 09/15/2016    Past Surgical History:  Procedure Laterality Date  . SHOULDER ARTHROSCOPY WITH LABRAL REPAIR Right 07/21/2016   Procedure: RIGHT SHOULDER ARTHROSCOPY WITH LABRAL REPAIR;  Surgeon: Kathryne Hitch, MD;  Location: [facility];  Service: Orthopedics;  Laterality: Right;  Shoulder Block       Home Medications    Prior to Admission medications   Medication Sig Start Date End Date Taking? Authorizing Provider  albuterol (PROVENTIL HFA;VENTOLIN HFA) 108 (90 Base) MCG/ACT inhaler Inhale 2 puffs into the lungs every 6 (six) hours as needed for wheezing or shortness of breath. 09/18/16   Kathlen Mody, MD  albuterol (PROVENTIL) (2.5 MG/3ML) 0.083% nebulizer solution Take 3 mLs (2.5 mg total) by nebulization every 2 (two) hours as needed for wheezing or shortness of breath. 09/18/16   Kathlen Mody, MD  Aspirin Effervescent (ALKA-SELTZER PO) Take 1 capsule by mouth daily.    Historical Provider, MD  dextromethorphan-guaiFENesin (MUCINEX DM) 30-600 MG 12hr tablet Take 1 tablet  by mouth 2 (two) times daily. 09/18/16   Kathlen Mody, MD  HYDROcodone-acetaminophen (NORCO/VICODIN) 5-325 MG tablet Take 5-325 tablets by mouth every 6 (six) hours as needed. for pain 08/28/16   Historical Provider, MD  levofloxacin (LEVAQUIN) 500 MG tablet Take 1 tablet (500 mg total) by mouth daily. 09/18/16   Kathlen Mody, MD  pantoprazole (PROTONIX) 40 MG tablet Take 1 tablet (40 mg total) by mouth daily. 09/19/16   Kathlen Mody, MD  predniSONE (DELTASONE) 20 MG tablet Take 2 tablets (40 mg total) by mouth daily. 09/18/16   Kathlen Mody, MD  traMADol (ULTRAM) 50 MG tablet Take 1 tablet (50 mg total) by mouth every 6 (six) hours as needed for moderate pain. 09/18/16   Kathlen Mody, MD    Family History Family History  Problem Relation Age of Onset  . Hypertension Mother   . Congestive Heart Failure Maternal Grandmother   . Hypertension Maternal Grandmother     Social History Social History  Substance Use Topics  . Smoking status: Current Every Day Smoker    Packs/day: 0.25    Years: 10.00    Types: Cigarettes  . Smokeless tobacco: Never Used  . Alcohol use Yes     Comment: occasionally     Allergies   Penicillins; Shellfish allergy; and Betadine [povidone iodine]   Review of Systems Review of Systems  Constitutional: Negative for chills and  fever.  Skin:       Redness and swelling of right and left upper thighs  All other systems reviewed and are negative.    Physical Exam Updated Vital Signs BP 139/84   Pulse 97   Temp 98.6 F (37 C)   Resp 16   SpO2 97%   Physical Exam  Constitutional: He is oriented to person, place, and time. He appears well-developed and well-nourished. No distress.  Obese, NAD, on phone during exam  HENT:  Head: Normocephalic and atraumatic.  Eyes: Conjunctivae are normal. Pupils are equal, round, and reactive to light. Right eye exhibits no discharge. Left eye exhibits no discharge. No scleral icterus.  Neck: Normal range of motion.    Cardiovascular: Normal rate.   Pulmonary/Chest: Effort normal. No respiratory distress.  Abdominal: He exhibits no distension.  Neurological: He is alert and oriented to person, place, and time.  Skin: Skin is warm and dry.  1cm area of redness and induration on right upper thigh with central wound which is not actively draining. No fluctuance  2 cm area of redness and induration on left upper thigh with central area which is not draining. No fluctuance.   Psychiatric: He has a normal mood and affect. His behavior is normal.  Nursing note and vitals reviewed.    ED Treatments / Results  Labs (all labs ordered are listed, but only abnormal results are displayed) Labs Reviewed - No data to display  EKG  EKG Interpretation None       Radiology No results found.  Procedures Procedures (including critical care time)  EMERGENCY DEPARTMENT US SOFT TISSUE INTERPRETATION "Study: Limited Ultrasound of the noted body part in comments below"  INDICATIONS: Soft tissue infection Multiple views of the body part are obtained with a multi-frequency linear probe  PERFORMED BY:  Myself  IMAGES ARCHIVED?: No  SIDE:Right   BODY PART:Lower extremity  FINDINGS: No abcess noted and Cellulitis present  LIMITATIONS:  Emergent Procedure  INTERPRETATION:  No abcess noted  COMMENT:  No fluctuance   EMERGENCY DEPARTMENT US SOFT TISSUE INTERPRETATION "Study: Limited Ultrasound of the noted body part in comments below"  INDICATIONS: Soft tissue infection Multiple views of the body part are obtained with a multi-frequency linear probe  PERFORMED BY:  Myself  IMAGES ARCHIVED?: No  SIDE:Left  BODY PART:Lower extremity  FINDINGS: No abcess noted and Cellulitis present  LIMITATIONS:  Emergent Procedure  INTERPRETATION:  No abcess noted and Cellulitis present  COMMENT:  No fluctuance   Medications Ordered in ED Medications - No data to display     Initial Impression /  Assessment and Plan / ED Course  I have reviewed the triage vital signs and the nursing notes.  Pertinent labs & imaging results that were available during my care of the patient were reviewed by me and considered in my medical decision making (see chart for details).  Clinical Course    26 year old with cellulitis without evidence of drainable abscess on US. Patient is afebrile, not tachycardic or tachypneic, normotensive, and not hypoxic. Advised supportive care. Antibiotics not currently indicated. Return for worsening symptoms.  Final Clinical Impressions(s) / ED Diagnoses   Final diagnoses:  Cellulitis, unspecified cellulitis site    New Prescriptions New Prescriptions   No medications on file     Bethel BornKelly Marie Kesi Perrow, PA-C 12/09/16 1008    Maia PlanJoshua G Long, MD 12/10/16 1052

## 2016-12-08 NOTE — ED Triage Notes (Signed)
Patient c/o abscess on right and left upper thig areas.  Patient states that right side has been there about week and has drained. The left side has been there about 2 days.  Patient has PMH of abscesses.

## 2016-12-08 NOTE — Discharge Instructions (Signed)
Warm compresses as needed Ibuprofen 600mg  3-4 times a day for pain and swelling Return if you develop a fever or worsening symptoms

## 2016-12-08 NOTE — ED Notes (Signed)
Pt ambulatory and independent at discharge.  Verbalized understanding of discharge instructions 

## 2017-02-15 ENCOUNTER — Encounter (HOSPITAL_COMMUNITY): Payer: Self-pay | Admitting: Emergency Medicine

## 2017-02-15 ENCOUNTER — Emergency Department (HOSPITAL_COMMUNITY)
Admission: EM | Admit: 2017-02-15 | Discharge: 2017-02-15 | Disposition: A | Discharge status: Home / Self Care | Payer: PRIVATE HEALTH INSURANCE | Attending: Emergency Medicine | Admitting: Emergency Medicine

## 2017-02-15 DIAGNOSIS — Z7982 Long term (current) use of aspirin: Secondary | ICD-10-CM | POA: Insufficient documentation

## 2017-02-15 DIAGNOSIS — H9201 Otalgia, right ear: Secondary | ICD-10-CM

## 2017-02-15 DIAGNOSIS — F17210 Nicotine dependence, cigarettes, uncomplicated: Secondary | ICD-10-CM | POA: Insufficient documentation

## 2017-02-15 DIAGNOSIS — H60501 Unspecified acute noninfective otitis externa, right ear: Secondary | ICD-10-CM

## 2017-02-15 DIAGNOSIS — J45909 Unspecified asthma, uncomplicated: Secondary | ICD-10-CM | POA: Insufficient documentation

## 2017-02-15 DIAGNOSIS — H6091 Unspecified otitis externa, right ear: Secondary | ICD-10-CM | POA: Insufficient documentation

## 2017-02-15 MED ORDER — OFLOXACIN 0.3 % OP SOLN
5.0000 [drp] | Freq: Every day | OPHTHALMIC | Status: DC
  Administered 2017-02-15: 5 [drp] via OTIC
  Filled 2017-02-15: qty 5

## 2017-02-15 MED ORDER — CEFDINIR 300 MG PO CAPS: 300.0000 mg | ORAL_CAPSULE | Freq: Two times a day (BID) | ORAL | 0 refills | Status: DC

## 2017-02-15 MED ORDER — IBUPROFEN 800 MG PO TABS: 800.0000 mg | ORAL_TABLET | Freq: Three times a day (TID) | ORAL | 0 refills | Status: DC | PRN

## 2017-02-15 NOTE — ED Provider Notes (Signed)
[facility]-EMERGENCY DEPT Provider Note   CSN: [id number] Arrival date & time: 02/15/17  0800     History   Chief Complaint Chief Complaint  Patient presents with  . Otalgia    HPI Donald Fisher is a 26 y.o. male.  The history is provided by the patient and medical records. No language interpreter was used.  Otalgia  Pertinent negatives include no sore throat and no cough.    Donald Fisher is a 26 y.o. male who presents to the Emergency Department complaining of acute onset of right aching, throbbing ear pain which began a few hours prior to arrival. Associated symptoms include headache, nasal congestion. No medications or treatments prior to arrival for symptoms. No fevers, cough, sore throat. No history of ear infections. No recent swimming. Does not use ear plugs daily.     Past Medical History:  Diagnosis Date  . Asthma    hx of   . Bronchitis   . GERD (gastroesophageal reflux disease)   . Heart murmur    hx of   . Pneumonia    hx of     Patient Active Problem List   Diagnosis Date Noted  . Status post labral repair of shoulder 09/25/2016  . Asthma attack 09/15/2016  . Abdominal pain 09/15/2016    Past Surgical History:  Procedure Laterality Date  . SHOULDER ARTHROSCOPY WITH LABRAL REPAIR Right 07/21/2016   Procedure: RIGHT SHOULDER ARTHROSCOPY WITH LABRAL REPAIR;  Surgeon: Kathryne Hitchhristopher Y Blackman, MD;  Location: [facility];  Service: Orthopedics;  Laterality: Right;  Shoulder Block       Home Medications    Prior to Admission medications   Medication Sig Start Date End Date Taking? Authorizing Provider  albuterol (PROVENTIL HFA;VENTOLIN HFA) 108 (90 Base) MCG/ACT inhaler Inhale 2 puffs into the lungs every 6 (six) hours as needed for wheezing or shortness of breath. 09/18/16   Kathlen ModyVijaya Akula, MD  albuterol (PROVENTIL) (2.5 MG/3ML) 0.083% nebulizer solution Take 3 mLs (2.5 mg total) by nebulization every 2 (two) hours as needed for wheezing or shortness of breath.  09/18/16   Kathlen ModyVijaya Akula, MD  Aspirin Effervescent (ALKA-SELTZER PO) Take 1 capsule by mouth daily.    Historical Provider, MD  cefdinir (OMNICEF) 300 MG capsule Take 1 capsule (300 mg total) by mouth 2 (two) times daily. 02/15/17   Caden Fukushima Pilcher Aija Scarfo, PA-C  dextromethorphan-guaiFENesin [facility](MUCINEX DM) 30-600 MG 12hr tablet Take 1 tablet by mouth 2 (two) times daily. 09/18/16   Kathlen ModyVijaya Akula, MD  HYDROcodone-acetaminophen (NORCO/VICODIN) 5-325 MG tablet Take 5-325 tablets by mouth every 6 (six) hours as needed. for pain 08/28/16   Historical Provider, MD  ibuprofen (ADVIL,MOTRIN) 800 MG tablet Take 1 tablet (800 mg total) by mouth every 8 (eight) hours as needed. 02/15/17   Chase PicketJaime Pilcher Marium Ragan, PA-C  levofloxacin (LEVAQUIN) 500 MG tablet Take 1 tablet (500 mg total) by mouth daily. 09/18/16   Kathlen ModyVijaya Akula, MD  pantoprazole (PROTONIX) 40 MG tablet Take 1 tablet (40 mg total) by mouth daily. 09/19/16   Kathlen ModyVijaya Akula, MD  predniSONE (DELTASONE) 20 MG tablet Take 2 tablets (40 mg total) by mouth daily. 09/18/16   Kathlen ModyVijaya Akula, MD  traMADol (ULTRAM) 50 MG tablet Take 1 tablet (50 mg total) by mouth every 6 (six) hours as needed for moderate pain. 09/18/16   Kathlen ModyVijaya Akula, MD    Family History Family History  Problem Relation Age of Onset  . Hypertension Mother   . Congestive Heart Failure Maternal Grandmother   .  Hypertension Maternal Grandmother     Social History Social History  Substance Use Topics  . Smoking status: Current Every Day Smoker    Packs/day: 0.25    Years: 10.00    Types: Cigarettes  . Smokeless tobacco: Never Used  . Alcohol use Yes     Comment: occasionally     Allergies   Penicillins; Shellfish allergy; and Betadine [povidone iodine]   Review of Systems Review of Systems  Constitutional: Negative for fever.  HENT: Positive for congestion and ear pain. Negative for sore throat.   Respiratory: Negative for cough.      Physical Exam Updated Vital Signs BP 129/80 (BP  Location: Left Arm)   Pulse 67   Temp 97.9 F (36.6 C) (Oral)   Resp 14   Ht 5\' 6"  (1.676 m)   Wt 112.5 kg   SpO2 100%   BMI 40.03 kg/m   Physical Exam  Constitutional: He appears well-developed and well-nourished. No distress.  HENT:  Head: Normocephalic and atraumatic.  + tragal tenderness. Swollen canal. No mastoid tenderness/erythema/swelling. Cerumen occluding visualization of TM.   Neck: Neck supple.  No meningeal signs.  Cardiovascular: Normal rate, regular rhythm and normal heart sounds.   No murmur heard. Pulmonary/Chest: Effort normal and breath sounds normal. No respiratory distress. He has no wheezes. He has no rales.  Musculoskeletal: Normal range of motion.  Neurological: He is alert.  Skin: Skin is warm and dry.  Nursing note and vitals reviewed.    ED Treatments / Results  Labs (all labs ordered are listed, but only abnormal results are displayed) Labs Reviewed - No data to display  EKG  EKG Interpretation None       Radiology No results found.  Procedures Procedures (including critical care time)  Medications Ordered in ED Medications  ofloxacin (OCUFLOX) 0.3 % ophthalmic solution 5 drop (5 drops Right Ear Given 02/15/17 6962)     Initial Impression / Assessment and Plan / ED Course  I have reviewed the triage vital signs and the nursing notes.  Pertinent labs & imaging results that were available during my care of the patient were reviewed by me and considered in my medical decision making (see chart for details).    Donald Fisher is a 27 y.o. male who presents to ED for right ear pain x 1 day. He does have tragal tenderness and erythematous canal likely otitis externa. No mastoid tenderness/erythema/swelling to suggest mastoiditis. TM was occluded due to cerumen. I began flushing the ear to perform cerumen impaction, but patient asked me to quit due to pain. Discussed importance of visualizing TM, but patient declines. Given inability to  visualize TM, will treat with Cefdinir (PCN allergic). Also given oflaxacin gtts for externa. PCP follow up for ear check recommended. Return to ER if symptoms worsen or do not improve in 2-3 days. All questions answered.    Final Clinical Impressions(s) / ED Diagnoses   Final diagnoses:  None    New Prescriptions Discharge Medication List as of 02/15/2017  9:13 AM    START taking these medications   Details  cefdinir (OMNICEF) 300 MG capsule Take 1 capsule (300 mg total) by mouth 2 (two) times daily., Starting Thu 02/15/2017, Print    ibuprofen (ADVIL,MOTRIN) 800 MG tablet Take 1 tablet (800 mg total) by mouth every 8 (eight) hours as needed., Starting Thu 02/15/2017, Print         [employer] Rashea Hoskie, PA-C 02/15/17 9528    Vanetta Mulders, MD  02/16/17 1102  

## 2017-02-15 NOTE — Discharge Instructions (Signed)
Follow up with your primary care doctor in 7 days to make sure the treatment has worked. If there is no improvement in 2-3 days, please follow up sooner or return to ER for recheck. Ibuprofen as needed for pain.   GET HELP RIGHT AWAY IF:  You have a temperature by mouth above 102 F (38.9 C), not controlled by medicine.  There is ear pain after 3 days.  You develops a allergic reaction, itchy rash, loss of hearing, new or worsening symptoms develop or you have any additional concerns.

## 2017-02-15 NOTE — ED Triage Notes (Signed)
Pt reports sudden onset of right ear pain while he was at work, denies any injury.

## 2017-04-25 ENCOUNTER — Encounter (HOSPITAL_BASED_OUTPATIENT_CLINIC_OR_DEPARTMENT_OTHER): Payer: Self-pay | Admitting: *Deleted

## 2017-04-25 ENCOUNTER — Emergency Department (HOSPITAL_BASED_OUTPATIENT_CLINIC_OR_DEPARTMENT_OTHER)
Admission: EM | Admit: 2017-04-25 | Discharge: 2017-04-26 | Disposition: A | Discharge status: Home / Self Care | Payer: PRIVATE HEALTH INSURANCE | Attending: Emergency Medicine | Admitting: Emergency Medicine

## 2017-04-25 DIAGNOSIS — Z79899 Other long term (current) drug therapy: Secondary | ICD-10-CM | POA: Insufficient documentation

## 2017-04-25 DIAGNOSIS — J45909 Unspecified asthma, uncomplicated: Secondary | ICD-10-CM | POA: Insufficient documentation

## 2017-04-25 DIAGNOSIS — F17210 Nicotine dependence, cigarettes, uncomplicated: Secondary | ICD-10-CM | POA: Insufficient documentation

## 2017-04-25 DIAGNOSIS — R1032 Left lower quadrant pain: Secondary | ICD-10-CM | POA: Insufficient documentation

## 2017-04-25 NOTE — ED Triage Notes (Signed)
Pt c/o left lower abd pain x 2 days 

## 2017-04-26 ENCOUNTER — Encounter (HOSPITAL_BASED_OUTPATIENT_CLINIC_OR_DEPARTMENT_OTHER): Payer: Self-pay | Admitting: *Deleted

## 2017-04-26 ENCOUNTER — Emergency Department (HOSPITAL_BASED_OUTPATIENT_CLINIC_OR_DEPARTMENT_OTHER): Payer: PRIVATE HEALTH INSURANCE

## 2017-04-26 LAB — CBC WITH DIFFERENTIAL/PLATELET
BASOS ABS: 0.1 10*3/uL (ref 0.0–0.1)
BASOS PCT: 1 %
Eosinophils Absolute: 0.2 10*3/uL (ref 0.0–0.7)
Eosinophils Relative: 3 %
HEMATOCRIT: 43.8 % (ref 39.0–52.0)
HEMOGLOBIN: 15.4 g/dL (ref 13.0–17.0)
LYMPHS PCT: 40 %
Lymphs Abs: 3.6 10*3/uL (ref 0.7–4.0)
MCH: 31.1 pg (ref 26.0–34.0)
MCHC: 35.2 g/dL (ref 30.0–36.0)
MCV: 88.5 fL (ref 78.0–100.0)
MONO ABS: 0.8 10*3/uL (ref 0.1–1.0)
MONOS PCT: 9 %
NEUTROS ABS: 4.4 10*3/uL (ref 1.7–7.7)
NEUTROS PCT: 47 %
Platelets: 196 10*3/uL (ref 150–400)
RBC: 4.95 MIL/uL (ref 4.22–5.81)
RDW: 12.8 % (ref 11.5–15.5)
WBC: 9 10*3/uL (ref 4.0–10.5)

## 2017-04-26 LAB — COMPREHENSIVE METABOLIC PANEL
ALK PHOS: 47 U/L (ref 38–126)
ALT: 25 U/L (ref 17–63)
AST: 20 U/L (ref 15–41)
Albumin: 4.2 g/dL (ref 3.5–5.0)
Anion gap: 9 (ref 5–15)
BILIRUBIN TOTAL: 0.6 mg/dL (ref 0.3–1.2)
BUN: 11 mg/dL (ref 6–20)
CALCIUM: 8.9 mg/dL (ref 8.9–10.3)
CHLORIDE: 104 mmol/L (ref 101–111)
CO2: 25 mmol/L (ref 22–32)
CREATININE: 0.92 mg/dL (ref 0.61–1.24)
Glucose, Bld: 92 mg/dL (ref 65–99)
Potassium: 3.1 mmol/L — ABNORMAL LOW (ref 3.5–5.1)
Sodium: 138 mmol/L (ref 135–145)
TOTAL PROTEIN: 6.9 g/dL (ref 6.5–8.1)

## 2017-04-26 LAB — URINALYSIS, ROUTINE W REFLEX MICROSCOPIC
Bilirubin Urine: NEGATIVE
GLUCOSE, UA: NEGATIVE mg/dL
Hgb urine dipstick: NEGATIVE
Ketones, ur: NEGATIVE mg/dL
NITRITE: NEGATIVE
PH: 6 (ref 5.0–8.0)
Protein, ur: NEGATIVE mg/dL
SPECIFIC GRAVITY, URINE: 1.025 (ref 1.005–1.030)

## 2017-04-26 LAB — URINALYSIS, MICROSCOPIC (REFLEX)

## 2017-04-26 MED ORDER — POTASSIUM CHLORIDE CRYS ER 20 MEQ PO TBCR
40.0000 meq | EXTENDED_RELEASE_TABLET | Freq: Once | ORAL | Status: AC
  Administered 2017-04-26: 40 meq via ORAL
  Filled 2017-04-26: qty 2

## 2017-04-26 MED ORDER — KETOROLAC TROMETHAMINE 30 MG/ML IJ SOLN
30.0000 mg | Freq: Once | INTRAMUSCULAR | Status: AC
  Administered 2017-04-26: 30 mg via INTRAVENOUS
  Filled 2017-04-26: qty 1

## 2017-04-26 MED ORDER — SODIUM CHLORIDE 0.9 % IV BOLUS (SEPSIS)
1000.0000 mL | Freq: Once | INTRAVENOUS | Status: AC
  Administered 2017-04-26: 1000 mL via INTRAVENOUS

## 2017-04-26 MED ORDER — DICYCLOMINE HCL 20 MG PO TABS: 20.0000 mg | ORAL_TABLET | Freq: Two times a day (BID) | ORAL | 0 refills | Status: DC

## 2017-04-26 MED ORDER — DICYCLOMINE HCL 10 MG PO CAPS
10.0000 mg | ORAL_CAPSULE | Freq: Once | ORAL | Status: AC
  Administered 2017-04-26: 10 mg via ORAL
  Filled 2017-04-26: qty 1

## 2017-04-26 MED ORDER — SULFAMETHOXAZOLE-TRIMETHOPRIM 800-160 MG PO TABS: 1.0000 | ORAL_TABLET | Freq: Two times a day (BID) | ORAL | 0 refills | Status: AC

## 2017-04-26 NOTE — Discharge Instructions (Signed)
Follow-up with the referred GI doctor.   Make sure you are drinking lots of fluid to stay hydrated.   Take medication as directed for pain.   Return to the Emergency Department for any worsening pain, fever, nausea/vomiting, blood in stool or any other concerns.

## 2017-04-26 NOTE — ED Provider Notes (Signed)
[facility] Provider [facility]   CSN: [id number] Arrival date & time: 04/25/17  2338     History   Chief Complaint Chief Complaint  Patient presents with  . Abdominal Pain    HPI Donald Fisher is a 26 y.o. male who presents with left lower quadrant abdominal pain that began yesterday. Patient reports that since onset of pain and has progressively worsened over the last 2 days. He reports that pain radiates to the left flank and down to the left lower abdomen and stops at his groin.  He reports that pain is worsened with movement and ambulation. He denies any other alleviating factors. He has not taken any medications for pain. He reports that his last bowel movement was earlier today and was normal. He denies any presence of black stools or blood. He denies any recent sickness. He has been able to eat and drink okay without difficulty. He denies any fevers, nausea/vomiting, chest pain, shortness of breath, dysuria or hematuria. Patient reports that he does smoke marijuana and states that he last smoked it yesterday.  The history is provided by the patient.    Past Medical History:  Diagnosis Date  . Asthma    hx of   . Bronchitis   . GERD (gastroesophageal reflux disease)   . Heart murmur    hx of   . Pneumonia    hx of     Patient Active Problem List   Diagnosis Date Noted  . Status post labral repair of shoulder 09/25/2016  . Asthma attack 09/15/2016  . Abdominal pain 09/15/2016    Past Surgical History:  Procedure Laterality Date  . SHOULDER ARTHROSCOPY WITH LABRAL REPAIR Right 07/21/2016   Procedure: RIGHT SHOULDER ARTHROSCOPY WITH LABRAL REPAIR;  Surgeon: Kathryne Hitchhristopher Y Blackman, MD;  Location: [facility];  Service: Orthopedics;  Laterality: Right;  Shoulder Block       Home Medications    Prior to Admission medications   Medication Sig Start Date End Date Taking? Authorizing Provider  albuterol (PROVENTIL HFA;VENTOLIN HFA) 108 (90 Base) MCG/ACT inhaler  Inhale 2 puffs into the lungs every 6 (six) hours as needed for wheezing or shortness of breath. 09/18/16   Kathlen ModyAkula, Vijaya, MD  albuterol (PROVENTIL) (2.5 MG/3ML) 0.083% nebulizer solution Take 3 mLs (2.5 mg total) by nebulization every 2 (two) hours as needed for wheezing or shortness of breath. 09/18/16   Kathlen ModyAkula, Vijaya, MD  Aspirin Effervescent (ALKA-SELTZER PO) Take 1 capsule by mouth daily.    [provider]  dextromethorphan-guaiFENesin (MUCINEX DM) 30-600 MG 12hr tablet Take 1 tablet by mouth 2 (two) times daily. 09/18/16   Kathlen ModyAkula, Vijaya, MD  dicyclomine (BENTYL) 20 MG tablet Take 1 tablet (20 mg total) by mouth 2 (two) times daily. 04/26/17   Maxwell CaulLayden, Kacia Halley A, PA-C  ibuprofen (ADVIL,MOTRIN) 800 MG tablet Take 1 tablet (800 mg total) by mouth every 8 (eight) hours as needed. 02/15/17   Ward, Chase PicketJaime Pilcher, PA-C  pantoprazole (PROTONIX) 40 MG tablet Take 1 tablet (40 mg total) by mouth daily. 09/19/16   Kathlen ModyAkula, Vijaya, MD  sulfamethoxazole-trimethoprim (BACTRIM DS,SEPTRA DS) 800-160 MG tablet Take 1 tablet by mouth 2 (two) times daily. 04/26/17 05/03/17  Maxwell CaulLayden, Konrad Hoak A, PA-C    Family History Family History  Problem Relation Age of Onset  . Hypertension Mother   . Congestive Heart Failure Maternal Grandmother   . Hypertension Maternal Grandmother     Social History Social History  Substance Use Topics  . Smoking status: Current  Every Day Smoker    Packs/day: 0.50    Years: 10.00    Types: Cigarettes  . Smokeless tobacco: Never Used  . Alcohol use Yes     Comment: occasionally     Allergies   Penicillins; Shellfish allergy; and Betadine [povidone iodine]   Review of Systems Review of Systems  Constitutional: Negative for appetite change and fever.  Respiratory: Negative for shortness of breath.   Cardiovascular: Negative for chest pain.  Gastrointestinal: Positive for abdominal pain. Negative for blood in stool, constipation, diarrhea, nausea and vomiting.    Genitourinary: Negative for discharge, dysuria, hematuria and testicular pain.     Physical Exam Updated Vital Signs BP 127/79 (BP Location: Left Arm)   Pulse 85   Temp 98.4 F (36.9 C) (Oral)   Resp 20   Ht 5\' 7"  (1.702 m)   Wt 106.6 kg (235 lb)   SpO2 100%   BMI 36.81 kg/m   Physical Exam  Constitutional: He is oriented to person, place, and time. He appears well-developed and well-nourished.  Appears uncomfortable on examination table but in no acute distress.  HENT:  Head: Normocephalic and atraumatic.  Mouth/Throat: Oropharynx is clear and moist and mucous membranes are normal.  Eyes: Conjunctivae, EOM and lids are normal. Pupils are equal, round, and reactive to light.  Neck: Full passive range of motion without pain.  Cardiovascular: Normal rate, regular rhythm, normal heart sounds and normal pulses.  Exam reveals no gallop and no friction rub.   No murmur heard. Pulmonary/Chest: Effort normal and breath sounds normal.  Abdominal: Soft. Normal appearance and bowel sounds are normal. He exhibits no distension. There is tenderness in the left lower quadrant. There is CVA tenderness (left). There is no rigidity and no guarding. Hernia confirmed negative in the right inguinal area and confirmed negative in the left inguinal area.  Genitourinary: Testes normal and penis normal. Right testis shows no mass. Left testis shows no mass. Circumcised.  Genitourinary Comments: The exam was performed with a chaperone present. Normal testes and penis. No evidence of inguinal hernia bilaterally.  Musculoskeletal: Normal range of motion.  Neurological: He is alert and oriented to person, place, and time.  Skin: Skin is warm and dry. Capillary refill takes less than 2 seconds.  Psychiatric: He has a normal mood and affect. His speech is normal.  Nursing note and vitals reviewed.    ED Treatments / Results  Labs (all labs ordered are listed, but only abnormal results are  displayed) Labs Reviewed  COMPREHENSIVE METABOLIC PANEL - Abnormal; Notable for the following:       Result Value   Potassium 3.1 (*)    All other components within normal limits  URINALYSIS, ROUTINE W REFLEX MICROSCOPIC - Abnormal; Notable for the following:    APPearance CLOUDY (*)    Leukocytes, UA SMALL (*)    All other components within normal limits  URINALYSIS, MICROSCOPIC (REFLEX) - Abnormal; Notable for the following:    Bacteria, UA FEW (*)    Squamous Epithelial / LPF 0-5 (*)    All other components within normal limits  CBC WITH DIFFERENTIAL/PLATELET  GC/CHLAMYDIA PROBE AMP ([place]) NOT AT [facility]    EKG  EKG Interpretation None       Radiology Ct Renal Stone Study  Result Date: 04/26/2017 CLINICAL DATA:  Left lower quadrant pain for 2 days. EXAM: CT ABDOMEN AND PELVIS WITHOUT CONTRAST TECHNIQUE: Multidetector CT imaging of the abdomen and pelvis was performed following the standard protocol  without IV contrast. COMPARISON:  09/15/2016 FINDINGS: Lower chest: The lung bases are clear. Hepatobiliary: No focal liver abnormality is seen. No gallstones, gallbladder wall thickening, or biliary dilatation. Pancreas: Unremarkable. No pancreatic ductal dilatation or surrounding inflammatory changes. Spleen: Normal in size without focal abnormality. Adrenals/Urinary Tract: No adrenal gland nodules. 2 mm stone in the midpole right kidney. No hydronephrosis or hydroureter. No ureteral or bladder stones. Bladder is decompressed. Stomach/Bowel: Stomach is within normal limits. Appendix appears normal. No evidence of bowel wall thickening, distention, or inflammatory changes. Vascular/Lymphatic: No significant vascular findings are present. No enlarged abdominal or pelvic lymph nodes. Reproductive: Prostate is unremarkable. Other: No abdominal wall hernia or abnormality. No abdominopelvic ascites. Musculoskeletal: No acute or significant osseous findings. IMPRESSION: Nonobstructing stone  in the midpole right kidney. No ureteral stones or obstruction demonstrated. No acute process demonstrated in the abdomen or pelvis. Electronically Signed   By: Burman Nieves M.D.   On: 04/26/2017 00:54    Procedures Procedures (including critical care time)  Medications Ordered in ED Medications  dicyclomine (BENTYL) capsule 10 mg (not administered)  sodium chloride 0.9 % bolus 1,000 mL (0 mLs Intravenous Stopped 04/26/17 0115)  ketorolac (TORADOL) 30 MG/ML injection 30 mg (30 mg Intravenous Given 04/26/17 0025)  potassium chloride SA (K-DUR,KLOR-CON) CR tablet 40 mEq (40 mEq Oral Given 04/26/17 0114)     Initial Impression / Assessment and Plan / ED Course  I have reviewed the triage vital signs and the nursing notes.  Pertinent labs & imaging results that were available during my care of the patient were reviewed by me and considered in my medical decision making (see chart for details).     26 year old male who presents with left lower quadrant abdominal pain that began yesterday and has since worsened. No other associated symptoms. Patient is afebrile, non-toxic appearing, sitting comfortably on examination table. Vital signs reviewed. Patient is afebrile. No tachycardia or  hyportension noted. Consider kidney stone versus UTI. Low suspicion for acute viral etiology. History/physical exam are not concerning for acute appendicitis or diverticulitis. IVF given for fluid resuscitation. Analgesics given in the department. We'll plan to check basic labs including CBC, CMP, UA. Will also obtain CT renal stone study for evaluation of kidney stone.  Labs and imaging reviewed. CBC with no elevation of white blood cell count. CMP with low potassium but otherwise within normal limits. Oral potassium placement given in the department. UA does show some small leukocytes and white blood cells. Patient is asymptomatic. GC chlamydia urine sent. Discussed results with patient. He reports some improvement  in pain after Toradol use. Will order additional Bentyl for further pain relief.  Re-evaluation: Patient is sleeping comfortably on examination table. Patient has been able to tolerate PO in the department. Will plan to treat for UTI. Patient provided Bentyl for symptomatically. Instructed patient to follow-up with referred GI doctor for further evaluation. Strict return precautions discussed. Patient expresses understanding and agreement to plan.  Final Clinical Impressions(s) / ED Diagnoses   Final diagnoses:  Left lower quadrant pain    New Prescriptions New Prescriptions   DICYCLOMINE (BENTYL) 20 MG TABLET    Take 1 tablet (20 mg total) by mouth 2 (two) times daily.   SULFAMETHOXAZOLE-TRIMETHOPRIM (BACTRIM DS,SEPTRA DS) 800-160 MG TABLET    Take 1 tablet by mouth 2 (two) times daily.     Maxwell Caul, PA-C 04/26/17 0142    Maxwell Caul, PA-C 04/26/17 0142    Paula Libra, MD 04/26/17 1610

## 2017-04-27 LAB — URINE CULTURE: Culture: NO GROWTH

## 2017-05-01 LAB — GC/CHLAMYDIA PROBE AMP (~~LOC~~) NOT AT ARMC
CHLAMYDIA, DNA PROBE: NEGATIVE
NEISSERIA GONORRHEA: NEGATIVE

## 2017-06-22 ENCOUNTER — Emergency Department (HOSPITAL_COMMUNITY): Payer: Self-pay

## 2017-06-22 ENCOUNTER — Encounter (HOSPITAL_COMMUNITY): Payer: Self-pay | Admitting: Emergency Medicine

## 2017-06-22 ENCOUNTER — Emergency Department (HOSPITAL_COMMUNITY)
Admission: EM | Admit: 2017-06-22 | Discharge: 2017-06-23 | Disposition: A | Discharge status: Home / Self Care | Payer: Self-pay | Attending: Emergency Medicine | Admitting: Emergency Medicine

## 2017-06-22 DIAGNOSIS — N200 Calculus of kidney: Secondary | ICD-10-CM

## 2017-06-22 DIAGNOSIS — R52 Pain, unspecified: Secondary | ICD-10-CM

## 2017-06-22 DIAGNOSIS — J45909 Unspecified asthma, uncomplicated: Secondary | ICD-10-CM | POA: Insufficient documentation

## 2017-06-22 DIAGNOSIS — F17210 Nicotine dependence, cigarettes, uncomplicated: Secondary | ICD-10-CM | POA: Insufficient documentation

## 2017-06-22 DIAGNOSIS — Z79899 Other long term (current) drug therapy: Secondary | ICD-10-CM | POA: Insufficient documentation

## 2017-06-22 DIAGNOSIS — N50811 Right testicular pain: Secondary | ICD-10-CM

## 2017-06-22 LAB — CBC WITH DIFFERENTIAL/PLATELET
BASOS ABS: 0 10*3/uL (ref 0.0–0.1)
Basophils Relative: 0 %
Eosinophils Absolute: 0 10*3/uL (ref 0.0–0.7)
Eosinophils Relative: 0 %
HEMATOCRIT: 43.6 % (ref 39.0–52.0)
Hemoglobin: 14.5 g/dL (ref 13.0–17.0)
Lymphocytes Relative: 8 %
Lymphs Abs: 1 10*3/uL (ref 0.7–4.0)
MCH: 29.4 pg (ref 26.0–34.0)
MCHC: 33.3 g/dL (ref 30.0–36.0)
MCV: 88.3 fL (ref 78.0–100.0)
Monocytes Absolute: 0.4 10*3/uL (ref 0.1–1.0)
Monocytes Relative: 3 %
NEUTROS ABS: 11.4 10*3/uL — AB (ref 1.7–7.7)
Neutrophils Relative %: 89 %
Platelets: 175 10*3/uL (ref 150–400)
RBC: 4.94 MIL/uL (ref 4.22–5.81)
RDW: 13.1 % (ref 11.5–15.5)
WBC: 12.8 10*3/uL — AB (ref 4.0–10.5)

## 2017-06-22 LAB — BASIC METABOLIC PANEL
ANION GAP: 9 (ref 5–15)
BUN: 11 mg/dL (ref 6–20)
CHLORIDE: 107 mmol/L (ref 101–111)
CO2: 21 mmol/L — AB (ref 22–32)
Calcium: 8.9 mg/dL (ref 8.9–10.3)
Creatinine, Ser: 1.07 mg/dL (ref 0.61–1.24)
GFR calc Af Amer: >60 mL/min (ref >60)
GFR calc non Af Amer: >60 mL/min (ref >60)
GLUCOSE: 111 mg/dL — AB (ref 65–99)
POTASSIUM: 4.1 mmol/L (ref 3.5–5.1)
Sodium: 137 mmol/L (ref 135–145)

## 2017-06-22 LAB — URINALYSIS, ROUTINE W REFLEX MICROSCOPIC
BILIRUBIN URINE: NEGATIVE
Glucose, UA: NEGATIVE mg/dL
KETONES UR: NEGATIVE mg/dL
LEUKOCYTES UA: NEGATIVE
Nitrite: NEGATIVE
PH: 7 (ref 5.0–8.0)
Protein, ur: NEGATIVE mg/dL
SQUAMOUS EPITHELIAL / LPF: NONE SEEN
Specific Gravity, Urine: 1.012 (ref 1.005–1.030)

## 2017-06-22 MED ORDER — KETOROLAC TROMETHAMINE 30 MG/ML IJ SOLN
30.0000 mg | Freq: Once | INTRAMUSCULAR | Status: AC
  Administered 2017-06-22: 30 mg via INTRAVENOUS
  Filled 2017-06-22: qty 1

## 2017-06-22 MED ORDER — PROMETHAZINE HCL 25 MG PO TABS: 25.0000 mg | ORAL_TABLET | Freq: Four times a day (QID) | ORAL | 0 refills | Status: DC | PRN

## 2017-06-22 MED ORDER — MORPHINE SULFATE (PF) 4 MG/ML IV SOLN
4.0000 mg | Freq: Once | INTRAVENOUS | Status: AC
  Administered 2017-06-22: 4 mg via INTRAVENOUS
  Filled 2017-06-22: qty 1

## 2017-06-22 MED ORDER — FENTANYL CITRATE (PF) 100 MCG/2ML IJ SOLN
50.0000 ug | INTRAMUSCULAR | Status: DC | PRN
  Administered 2017-06-22: 50 ug via NASAL

## 2017-06-22 MED ORDER — ONDANSETRON 4 MG PO TBDP
ORAL_TABLET | ORAL | Status: AC
  Filled 2017-06-22: qty 2

## 2017-06-22 MED ORDER — FENTANYL CITRATE (PF) 100 MCG/2ML IJ SOLN
INTRAMUSCULAR | Status: AC
  Filled 2017-06-22: qty 2

## 2017-06-22 MED ORDER — OXYCODONE-ACETAMINOPHEN 5-325 MG PO TABS: 1.0000 | ORAL_TABLET | ORAL | 0 refills | Status: DC | PRN

## 2017-06-22 MED ORDER — SODIUM CHLORIDE 0.9 % IV BOLUS (SEPSIS)
1000.0000 mL | Freq: Once | INTRAVENOUS | Status: AC
  Administered 2017-06-22: 1000 mL via INTRAVENOUS

## 2017-06-22 MED ORDER — ONDANSETRON 4 MG PO TBDP
8.0000 mg | ORAL_TABLET | Freq: Once | ORAL | Status: AC
  Administered 2017-06-22: 8 mg via ORAL

## 2017-06-22 MED ORDER — HYDROMORPHONE HCL 1 MG/ML IJ SOLN
1.0000 mg | Freq: Once | INTRAMUSCULAR | Status: AC
  Administered 2017-06-22: 1 mg via INTRAVENOUS
  Filled 2017-06-22: qty 1

## 2017-06-22 NOTE — ED Provider Notes (Signed)
[facility]-EMERGENCY DEPT Provider Note   CSN: [id number] Arrival date & time: 06/22/17  1554     History   Chief Complaint Chief Complaint  Patient presents with  . Testicle Pain    HPI Donald Fisher is a 26 y.o. male.  Abrupt onset right testicular pain radiating to the right lower quadrant just prior to ED visit. No previous history of kidney stones or urinary tract infection. No trauma or torsion to the genitalia. Severity of pain is moderate to severe. Makes symptoms better or worse. No dysuria, hematuria, frequency, fever, sweats, chills.      Past Medical History:  Diagnosis Date  . Asthma    hx of   . Bronchitis   . GERD (gastroesophageal reflux disease)   . Heart murmur    hx of   . Pneumonia    hx of     Patient Active Problem List   Diagnosis Date Noted  . Status post labral repair of shoulder 09/25/2016  . Asthma attack 09/15/2016  . Abdominal pain 09/15/2016    Past Surgical History:  Procedure Laterality Date  . SHOULDER ARTHROSCOPY WITH LABRAL REPAIR Right 07/21/2016   Procedure: RIGHT SHOULDER ARTHROSCOPY WITH LABRAL REPAIR;  Surgeon: Kathryne Hitch, MD;  Location: [facility];  Service: Orthopedics;  Laterality: Right;  Shoulder Block       Home Medications    Prior to Admission medications   Medication Sig Start Date End Date Taking? Authorizing Provider  albuterol (PROVENTIL HFA;VENTOLIN HFA) 108 (90 Base) MCG/ACT inhaler Inhale 2 puffs into the lungs every 6 (six) hours as needed for wheezing or shortness of breath. 09/18/16  Yes Kathlen Mody, MD  albuterol (PROVENTIL) (2.5 MG/3ML) 0.083% nebulizer solution Take 3 mLs (2.5 mg total) by nebulization every 2 (two) hours as needed for wheezing or shortness of breath. 09/18/16  Yes Kathlen Mody, MD  dextromethorphan-guaiFENesin (MUCINEX DM) 30-600 MG 12hr tablet Take 1 tablet by mouth 2 (two) times daily. Patient not taking: Reported on 06/22/2017 09/18/16   Kathlen Mody, MD  dicyclomine  (BENTYL) 20 MG tablet Take 1 tablet (20 mg total) by mouth 2 (two) times daily. Patient not taking: Reported on 06/22/2017 04/26/17   Graciella Freer A, PA-C  ibuprofen (ADVIL,MOTRIN) 800 MG tablet Take 1 tablet (800 mg total) by mouth every 8 (eight) hours as needed. Patient not taking: Reported on 06/22/2017 02/15/17   Ward, Chase Picket, PA-C  oxyCODONE-acetaminophen (PERCOCET) 5-325 MG tablet Take 1-2 tablets by mouth every 4 (four) hours as needed. 06/22/17   Donnetta Hutching, MD  pantoprazole (PROTONIX) 40 MG tablet Take 1 tablet (40 mg total) by mouth daily. Patient not taking: Reported on 06/22/2017 09/19/16   Kathlen Mody, MD  promethazine (PHENERGAN) 25 MG tablet Take 1 tablet (25 mg total) by mouth every 6 (six) hours as needed for nausea or vomiting. 06/22/17   Donnetta Hutching, MD    Family History Family History  Problem Relation Age of Onset  . Hypertension Mother   . Congestive Heart Failure Maternal Grandmother   . Hypertension Maternal Grandmother     Social History Social History  Substance Use Topics  . Smoking status: Current Every Day Smoker    Packs/day: 0.50    Years: 10.00    Types: Cigarettes  . Smokeless tobacco: Never Used  . Alcohol use Yes     Comment: occasionally     Allergies   Penicillins; Shellfish allergy; and Betadine [povidone iodine]   Review of Systems Review of Systems  All other systems reviewed and are negative.    Physical Exam Updated Vital Signs BP (!) 100/54   Pulse 64   Temp 98.1 F (36.7 C) (Oral)   Resp 16   Ht 5\' 8"  (1.727 m)   Wt 108.9 kg (240 lb)   SpO2 97%   BMI 36.49 kg/m   Physical Exam  Constitutional: He is oriented to person, place, and time. He appears well-developed and well-nourished.  HENT:  Head: Normocephalic and atraumatic.  Eyes: Conjunctivae are normal.  Neck: Neck supple.  Cardiovascular: Normal rate and regular rhythm.   Pulmonary/Chest: Effort normal and breath sounds normal.  Abdominal: Soft. Bowel  sounds are normal.  Genitourinary:  Genitourinary Comments: Slight tenderness right testicle;  No masses  Musculoskeletal: Normal range of motion.  Neurological: He is alert and oriented to person, place, and time.  Skin: Skin is warm and dry.  Psychiatric: He has a normal mood and affect. His behavior is normal.  Nursing note and vitals reviewed.    ED Treatments / Results  Labs (all labs ordered are listed, but only abnormal results are displayed) Labs Reviewed  CBC WITH DIFFERENTIAL/PLATELET - Abnormal; Notable for the following:       Result Value   WBC 12.8 (*)    Neutro Abs 11.4 (*)    All other components within normal limits  BASIC METABOLIC PANEL - Abnormal; Notable for the following:    CO2 21 (*)    Glucose, Bld 111 (*)    All other components within normal limits  URINALYSIS, ROUTINE W REFLEX MICROSCOPIC - Abnormal; Notable for the following:    Hgb urine dipstick LARGE (*)    Bacteria, UA RARE (*)    All other components within normal limits    EKG  EKG Interpretation None       Radiology US Scrotum  Result Date: 06/22/2017 CLINICAL DATA:  Right testicular pain x3 hours EXAM: SCROTAL ULTRASOUND DOPPLER ULTRASOUND OF THE TESTICLES TECHNIQUE: Complete ultrasound examination of the testicles, epididymis, and other scrotal structures was performed. Color and spectral Doppler ultrasound were also utilized to evaluate blood flow to the testicles. COMPARISON:  None. FINDINGS: Right testicle Measurements: 4.3 x 1.9 x 2.5 cm. No mass or microlithiasis visualized. Left testicle Measurements: 4.5 x 2.1 x 2.7 cm. No mass noted. A few microliths are present within. Right epididymis: Normal in size with minimal heterogeneity noted. There is a nonspecific well-circumscribed hypoechoic 0.6 x 0.3 x 0.8 cm focus along the anterior aspect of the epididymal head possibly a tiny spermatocele or potentially stigmata of old remote epididymitis. Left epididymis:  Normal in size and  appearance. Hydrocele: Trace amount of fluid in the scrotum bilaterally, likely physiologic. Varicocele:  None visualized. Pulsed Doppler interrogation of both testes demonstrates normal low resistance arterial and venous waveforms bilaterally. IMPRESSION: 1. No evidence of testicular mass or torsion. 2. Tiny hypoechoic nonspecific focus in the right epididymal head measuring 0.6 x 0.3 x 0.8 cm that may represent a small spermatocele or stigmata of old remote epididymitis. 3. Minimal left-sided testicular microlithiasis. Current literature suggests that testicular microlithiasis is not a significant independent risk factor for development of testicular carcinoma, and that follow up imaging is not warranted in the absence of other risk factors. Monthly testicular self-examination and annual physical exams are considered appropriate surveillance. If patient has other risk factors for testicular carcinoma, then referral to Urology should be considered. (Reference: DeCastro, et al.: A 5-Year Follow up Study of Asymptomatic Men with Testicular  Microlithiasis. J Urol 2008; 179:1420-1423.) . Electronically Signed   By: Tollie Eth M.D.   On: 06/22/2017 18:36   [place] Art/ven Flow Abd Pelv Doppler  Result Date: 06/22/2017 CLINICAL DATA:  Right testicular pain x3 hours EXAM: SCROTAL ULTRASOUND DOPPLER ULTRASOUND OF THE TESTICLES TECHNIQUE: Complete ultrasound examination of the testicles, epididymis, and other scrotal structures was performed. Color and spectral Doppler ultrasound were also utilized to evaluate blood flow to the testicles. COMPARISON:  None. FINDINGS: Right testicle Measurements: 4.3 x 1.9 x 2.5 cm. No mass or microlithiasis visualized. Left testicle Measurements: 4.5 x 2.1 x 2.7 cm. No mass noted. A few microliths are present within. Right epididymis: Normal in size with minimal heterogeneity noted. There is a nonspecific well-circumscribed hypoechoic 0.6 x 0.3 x 0.8 cm focus along the anterior aspect of  the epididymal head possibly a tiny spermatocele or potentially stigmata of old remote epididymitis. Left epididymis:  Normal in size and appearance. Hydrocele: Trace amount of fluid in the scrotum bilaterally, likely physiologic. Varicocele:  None visualized. Pulsed Doppler interrogation of both testes demonstrates normal low resistance arterial and venous waveforms bilaterally. IMPRESSION: 1. No evidence of testicular mass or torsion. 2. Tiny hypoechoic nonspecific focus in the right epididymal head measuring 0.6 x 0.3 x 0.8 cm that may represent a small spermatocele or stigmata of old remote epididymitis. 3. Minimal left-sided testicular microlithiasis. Current literature suggests that testicular microlithiasis is not a significant independent risk factor for development of testicular carcinoma, and that follow up imaging is not warranted in the absence of other risk factors. Monthly testicular self-examination and annual physical exams are considered appropriate surveillance. If patient has other risk factors for testicular carcinoma, then referral to Urology should be considered. (Reference: DeCastro, et al.: A 5-Year Follow up Study of Asymptomatic Men with Testicular Microlithiasis. J Urol 2008; 179:1420-1423.) . Electronically Signed   By: Tollie Eth M.D.   On: 06/22/2017 18:36   Ct Renal Stone Study  Result Date: 06/22/2017 CLINICAL DATA:  Right testicular pain radiating to right lower quadrant. Nausea. EXAM: CT ABDOMEN AND PELVIS WITHOUT CONTRAST TECHNIQUE: Multidetector CT imaging of the abdomen and pelvis was performed following the standard protocol without IV contrast. COMPARISON:  Abdominal CT 04/26/2017 FINDINGS: Lower chest: Mild dependent atelectasis in the lung bases. No consolidation or pleural fluid. Hepatobiliary: No evidence of focal lesion allowing for lack contrast. Gallbladder physiologically distended, no calcified stone. No biliary dilatation. Pancreas: No ductal dilatation or  inflammation. Spleen: Normal in size without focal abnormality. Adrenals/Urinary Tract: Moderate right hydroureteronephrosis and perinephric stranding. The ureter is dilated throughout its course the bladder insertion. No urolithiasis, the previous nonobstructing right renal stone is no longer seen. Calcifications in the right lower pelvis is unchanged from prior exam and consistent with phleboliths. No left hydronephrosis or hydroureter. No left urolithiasis. Urinary bladder is minimally distended, no bladder stone. Normal adrenal glands. Stomach/Bowel: Stomach is within normal limits. Appendix appears normal. No evidence of bowel wall thickening, distention, or inflammatory changes. Vascular/Lymphatic: Normal caliber abdominal aorta. No abdominal or pelvic adenopathy. Reproductive: Prostate is unremarkable. Other: No free air, free fluid, or intra-abdominal fluid collection. Musculoskeletal: There are no acute or suspicious osseous abnormalities. There is transitional lumbosacral anatomy. IMPRESSION: 1. Moderate right hydroureteronephrosis and perinephric edema. No urolithiasis, the previous right renal stone is not seen. Findings favor recent passage of right renal stone versus less likely pyelonephritis. Recommend correlation with urinalysis to exclude urinary tract infection. 2. Pelvic calcifications are unchanged from prior exam and consistent  with phleboliths. Electronically Signed   By: Rubye OaksMelanie  Ehinger M.D.   On: 06/22/2017 21:25    Procedures Procedures (including critical care time)  Medications Ordered in ED Medications  fentaNYL (SUBLIMAZE) injection 50 mcg (50 mcg Nasal Given 06/22/17 1608)  fentaNYL (SUBLIMAZE) 100 MCG/2ML injection (not administered)  ondansetron (ZOFRAN-ODT) disintegrating tablet 8 mg (8 mg Oral Given 06/22/17 1610)  sodium chloride 0.9 % bolus 1,000 mL (0 mLs Intravenous Stopped 06/22/17 1829)  HYDROmorphone (DILAUDID) injection 1 mg (1 mg Intravenous Given 06/22/17 1718)    ketorolac (TORADOL) 30 MG/ML injection 30 mg (30 mg Intravenous Given 06/22/17 2020)  morphine 4 MG/ML injection 4 mg (4 mg Intravenous Given 06/22/17 2020)     Initial Impression / Assessment and Plan / ED Course  I have reviewed the triage vital signs and the nursing notes.  Pertinent labs & imaging results that were available during my care of the patient were reviewed by me and considered in my medical decision making (see chart for details).     Patient presents with abrupt onset right testicular pain. Ultrasound reveals no torsion. CT renal suggests a recent passage of a right-sided kidney stone. Discharge medications Percocet and Phenergan 25 mg. Referral to urology  Final Clinical Impressions(s) / ED Diagnoses   Final diagnoses:  Pain in right testicle  Right kidney stone    New Prescriptions New Prescriptions   OXYCODONE-ACETAMINOPHEN (PERCOCET) 5-325 MG TABLET    Take 1-2 tablets by mouth every 4 (four) hours as needed.   PROMETHAZINE (PHENERGAN) 25 MG TABLET    Take 1 tablet (25 mg total) by mouth every 6 (six) hours as needed for nausea or vomiting.     Donnetta Hutchingook, Gerod Caligiuri, MD 06/22/17 2328

## 2017-06-22 NOTE — ED Notes (Signed)
Patient returned from Ultrasound. 

## 2017-06-22 NOTE — ED Notes (Signed)
Patient transported to CT 

## 2017-06-22 NOTE — Discharge Instructions (Signed)
You have most likely passed a kidney stone. Prescription for pain and nausea medicine. Increase fluids. Follow-up with urologist if not improving. Phone number given.

## 2017-06-22 NOTE — ED Notes (Signed)
Patient transported to Ultrasound 

## 2017-06-22 NOTE — ED Triage Notes (Signed)
Pt reports acute R testicle pain that woke pt from sleep.  Pt reports nausea, denies injury, dysuria, penile discharge.

## 2017-09-21 ENCOUNTER — Emergency Department (HOSPITAL_BASED_OUTPATIENT_CLINIC_OR_DEPARTMENT_OTHER): Payer: Self-pay

## 2017-09-21 ENCOUNTER — Encounter (HOSPITAL_BASED_OUTPATIENT_CLINIC_OR_DEPARTMENT_OTHER): Payer: Self-pay | Admitting: *Deleted

## 2017-09-21 ENCOUNTER — Emergency Department (HOSPITAL_BASED_OUTPATIENT_CLINIC_OR_DEPARTMENT_OTHER)
Admission: EM | Admit: 2017-09-21 | Discharge: 2017-09-21 | Disposition: A | Discharge status: Home / Self Care | Payer: Self-pay | Attending: Emergency Medicine | Admitting: Emergency Medicine

## 2017-09-21 DIAGNOSIS — F17210 Nicotine dependence, cigarettes, uncomplicated: Secondary | ICD-10-CM | POA: Insufficient documentation

## 2017-09-21 DIAGNOSIS — R110 Nausea: Secondary | ICD-10-CM | POA: Insufficient documentation

## 2017-09-21 DIAGNOSIS — R1032 Left lower quadrant pain: Secondary | ICD-10-CM | POA: Insufficient documentation

## 2017-09-21 DIAGNOSIS — R631 Polydipsia: Secondary | ICD-10-CM | POA: Insufficient documentation

## 2017-09-21 DIAGNOSIS — R197 Diarrhea, unspecified: Secondary | ICD-10-CM | POA: Insufficient documentation

## 2017-09-21 DIAGNOSIS — J45909 Unspecified asthma, uncomplicated: Secondary | ICD-10-CM | POA: Insufficient documentation

## 2017-09-21 LAB — URINALYSIS, ROUTINE W REFLEX MICROSCOPIC
BILIRUBIN URINE: NEGATIVE
Glucose, UA: NEGATIVE mg/dL
HGB URINE DIPSTICK: NEGATIVE
Ketones, ur: NEGATIVE mg/dL
Leukocytes, UA: NEGATIVE
Nitrite: NEGATIVE
PROTEIN: NEGATIVE mg/dL
Specific Gravity, Urine: 1.015 (ref 1.005–1.030)
pH: 7.5 (ref 5.0–8.0)

## 2017-09-21 LAB — COMPREHENSIVE METABOLIC PANEL
ALT: 26 U/L (ref 17–63)
AST: 22 U/L (ref 15–41)
Albumin: 3.9 g/dL (ref 3.5–5.0)
Alkaline Phosphatase: 44 U/L (ref 38–126)
Anion gap: 6 (ref 5–15)
BILIRUBIN TOTAL: 0.8 mg/dL (ref 0.3–1.2)
BUN: 10 mg/dL (ref 6–20)
CHLORIDE: 106 mmol/L (ref 101–111)
CO2: 25 mmol/L (ref 22–32)
CREATININE: 0.81 mg/dL (ref 0.61–1.24)
Calcium: 8.7 mg/dL — ABNORMAL LOW (ref 8.9–10.3)
Glucose, Bld: 123 mg/dL — ABNORMAL HIGH (ref 65–99)
Potassium: 3.5 mmol/L (ref 3.5–5.1)
Sodium: 137 mmol/L (ref 135–145)
Total Protein: 6.4 g/dL — ABNORMAL LOW (ref 6.5–8.1)

## 2017-09-21 LAB — CBC WITH DIFFERENTIAL/PLATELET
BASOS ABS: 0 10*3/uL (ref 0.0–0.1)
BASOS PCT: 1 %
EOS ABS: 0.2 10*3/uL (ref 0.0–0.7)
EOS PCT: 3 %
HEMATOCRIT: 43.4 % (ref 39.0–52.0)
Hemoglobin: 14.9 g/dL (ref 13.0–17.0)
Lymphocytes Relative: 39 %
Lymphs Abs: 2.5 10*3/uL (ref 0.7–4.0)
MCH: 30.5 pg (ref 26.0–34.0)
MCHC: 34.3 g/dL (ref 30.0–36.0)
MCV: 88.8 fL (ref 78.0–100.0)
Monocytes Absolute: 0.4 10*3/uL (ref 0.1–1.0)
Monocytes Relative: 6 %
Neutro Abs: 3.2 10*3/uL (ref 1.7–7.7)
Neutrophils Relative %: 51 %
PLATELETS: 174 10*3/uL (ref 150–400)
RBC: 4.89 MIL/uL (ref 4.22–5.81)
RDW: 12.6 % (ref 11.5–15.5)
WBC: 6.3 10*3/uL (ref 4.0–10.5)

## 2017-09-21 LAB — LIPASE, BLOOD: LIPASE: 18 U/L (ref 11–51)

## 2017-09-21 LAB — CBG MONITORING, ED: Glucose-Capillary: 125 mg/dL — ABNORMAL HIGH (ref 65–99)

## 2017-09-21 MED ORDER — SODIUM CHLORIDE 0.9 % IV BOLUS (SEPSIS)
1000.0000 mL | Freq: Once | INTRAVENOUS | Status: AC
  Administered 2017-09-21: 1000 mL via INTRAVENOUS

## 2017-09-21 MED ORDER — DICYCLOMINE HCL 20 MG PO TABS: 20.0000 mg | ORAL_TABLET | Freq: Two times a day (BID) | ORAL | 0 refills | Status: DC | PRN

## 2017-09-21 MED FILL — DICYCLOMINE 20 MG TABLET: 20 | 5 days supply | Qty: 10 | Fill #0

## 2017-09-21 NOTE — ED Provider Notes (Signed)
[facility] EMERGENCY DEPARTMENT Provider Note   CSN: [id number] Arrival date & time: 09/21/17  1146     History   Chief Complaint No chief complaint on file.   HPI Donald Fisher is a 26 y.o. male.  HPI   Patient is a 26 year old male with a history of asthma and GERD presenting for 3 days of increased thirst, nausea, and dry lips.  Patient reports he began having soft stools yesterday.  Patient denies any watery diarrhea, hematochezia, or melena.  Patient reports he has had nausea without vomiting.  Patient denies any focal abdominal tenderness with the symptoms.  Patient has no history of abdominal surgeries.  Patient had a headache yesterday and has had loss of appetite, but has not had any fever or chills.  Patient denies any dysuria, increased urination, or urgency.  Patient reports he has been maintaining normal PO intake with symptoms.  Past Medical History:  Diagnosis Date  . Asthma    hx of   . Bronchitis   . GERD (gastroesophageal reflux disease)   . Heart murmur    hx of   . Pneumonia    hx of     Patient Active Problem List   Diagnosis Date Noted  . Status post labral repair of shoulder 09/25/2016  . Asthma attack 09/15/2016  . Abdominal pain 09/15/2016    Past Surgical History:  Procedure Laterality Date  . SHOULDER ARTHROSCOPY WITH LABRAL REPAIR Right 07/21/2016   Procedure: RIGHT SHOULDER ARTHROSCOPY WITH LABRAL REPAIR;  Surgeon: Kathryne Hitch, MD;  Location: [facility];  Service: Orthopedics;  Laterality: Right;  Shoulder Block       Home Medications    Prior to Admission medications   Medication Sig Start Date End Date Taking? Authorizing Provider  albuterol (PROVENTIL HFA;VENTOLIN HFA) 108 (90 Base) MCG/ACT inhaler Inhale 2 puffs into the lungs every 6 (six) hours as needed for wheezing or shortness of breath. 09/18/16  Yes Kathlen Mody, MD  albuterol (PROVENTIL) (2.5 MG/3ML) 0.083% nebulizer solution Take 3 mLs (2.5 mg total)  by nebulization every 2 (two) hours as needed for wheezing or shortness of breath. 09/18/16   Kathlen Mody, MD  dextromethorphan-guaiFENesin (MUCINEX DM) 30-600 MG 12hr tablet Take 1 tablet by mouth 2 (two) times daily. Patient not taking: Reported on 06/22/2017 09/18/16   Kathlen Mody, MD  dicyclomine (BENTYL) 20 MG tablet Take 1 tablet (20 mg total) by mouth 2 (two) times daily. Patient not taking: Reported on 06/22/2017 04/26/17   Graciella Freer A, PA-C  ibuprofen (ADVIL,MOTRIN) 800 MG tablet Take 1 tablet (800 mg total) by mouth every 8 (eight) hours as needed. Patient not taking: Reported on 06/22/2017 02/15/17   Ward, Chase Picket, PA-C  oxyCODONE-acetaminophen (PERCOCET) 5-325 MG tablet Take 1-2 tablets by mouth every 4 (four) hours as needed. 06/22/17   Donnetta Hutching, MD  pantoprazole (PROTONIX) 40 MG tablet Take 1 tablet (40 mg total) by mouth daily. Patient not taking: Reported on 06/22/2017 09/19/16   Kathlen Mody, MD  promethazine (PHENERGAN) 25 MG tablet Take 1 tablet (25 mg total) by mouth every 6 (six) hours as needed for nausea or vomiting. 06/22/17   Donnetta Hutching, MD    Family History Family History  Problem Relation Age of Onset  . Hypertension Mother   . Congestive Heart Failure Maternal Grandmother   . Hypertension Maternal Grandmother     Social History Social History  Substance Use Topics  . Smoking status: Current Every Day Smoker  Packs/day: 0.50    Years: 10.00    Types: Cigarettes  . Smokeless tobacco: Never Used  . Alcohol use Yes     Comment: occasionally     Allergies   Penicillins; Shellfish allergy; and Betadine [povidone iodine]   Review of Systems Review of Systems  Constitutional: Negative for chills and fever.  HENT: Negative for congestion, rhinorrhea, sinus pain and sore throat.   Eyes: Negative for visual disturbance.  Respiratory: Negative for cough, chest tightness and shortness of breath.   Cardiovascular: Negative for chest pain,  palpitations and leg swelling.  Gastrointestinal: Positive for nausea. Negative for abdominal pain, constipation, diarrhea and vomiting.       + soft stools  Endocrine: Positive for polydipsia.  Genitourinary: Negative for dysuria, flank pain, testicular pain and urgency.  Musculoskeletal: Negative for back pain and myalgias.  Skin: Negative for rash.  Neurological: Positive for dizziness and headaches. Negative for syncope and light-headedness.     Physical Exam Updated Vital Signs BP 123/80 (BP Location: Left Arm)   Pulse (!) 55   Temp 97.7 F (36.5 C) (Oral)   Resp 16   Ht 5\' 7"  (1.702 m)   Wt 108.9 kg (240 lb)   SpO2 99%   BMI 37.59 kg/m   Physical Exam  Constitutional: He appears well-developed and well-nourished. No distress.  HENT:  Head: Normocephalic and atraumatic.  Mouth/Throat: Oropharynx is clear and moist. No oropharyngeal exudate.  Eyes: Pupils are equal, round, and reactive to light. Conjunctivae and EOM are normal.  Neck: Normal range of motion. Neck supple.  Cardiovascular: Normal rate, regular rhythm, S1 normal and S2 normal.   No murmur heard. Pulmonary/Chest: Effort normal and breath sounds normal. He has no wheezes. He has no rales.  Abdominal: Soft. He exhibits no distension. There is no guarding.  Generalized abdominal discomfort particularly in left abdomen and right upper abdomen.  No guarding.  Musculoskeletal: Normal range of motion. He exhibits no edema or deformity.  Lymphadenopathy:    He has no cervical adenopathy.  Neurological: He is alert.  Cranial nerves grossly intact. Patient was extremities with good coordination and symmetrically.  Skin: Skin is warm and dry. No rash noted. No erythema.  Psychiatric: He has a normal mood and affect. His behavior is normal. Judgment and thought content normal.  Nursing note and vitals reviewed.    ED Treatments / Results  Labs (all labs ordered are listed, but only abnormal results are  displayed) Labs Reviewed  COMPREHENSIVE METABOLIC PANEL - Abnormal; Notable for the following:       Result Value   Glucose, Bld 123 (*)    Calcium 8.7 (*)    Total Protein 6.4 (*)    All other components within normal limits  CBG MONITORING, ED - Abnormal; Notable for the following:    Glucose-Capillary 125 (*)    All other components within normal limits  URINALYSIS, ROUTINE W REFLEX MICROSCOPIC  CBC WITH DIFFERENTIAL/PLATELET  LIPASE, BLOOD  CBG MONITORING, ED    EKG  EKG Interpretation None       Radiology Dg Abd Acute W/chest  Result Date: 09/21/2017 CLINICAL DATA:  Extreme thirst with left lower quadrant pain. EXAM: DG ABDOMEN ACUTE W/ 1V CHEST COMPARISON:  CT scan 06/22/2017 FINDINGS: The lungs are clear without focal pneumonia, edema, pneumothorax or pleural effusion. The cardiopericardial silhouette is within normal limits for size. The visualized bony structures of the thorax are intact. Upright film shows no evidence for intraperitoneal free air. There  is no evidence for gaseous bowel dilation to suggest obstruction. Visualized bony anatomy unremarkable. IMPRESSION: Negative abdominal radiographs.  No acute cardiopulmonary disease. Electronically Signed   By: Kennith Center M.D.   On: 09/21/2017 13:04    Procedures Procedures (including critical care time)  Medications Ordered in ED Medications  sodium chloride 0.9 % bolus 1,000 mL (0 mLs Intravenous Stopped 09/21/17 1358)     Initial Impression / Assessment and Plan / ED Course  I have reviewed the triage vital signs and the nursing notes.  Pertinent labs & imaging results that were available during my care of the patient were reviewed by me and considered in my medical decision making (see chart for details).    Final Clinical Impressions(s) / ED Diagnoses   Final diagnoses:  None   Patient is afebrile, normotensive, and non-tachycardic. Differential diagnosis includes gastritis, viral gastroenteritis,  pancreatitis.  Do not suspect SBO at this time, as patient is not having colicky abdominal pain, and patient has no history of abdominal surgeries.  Patient has had multiple CTs within the last 2 years assessing for renal stones and/or for abdominal tenderness.  Based on abdominal exam, I do not think proceeding to CT is warranted at this time.  Will assess after lab work and repeat abdominal exam.  Will obtain acute abdominal series to assess for bowel gas pattern.  Patient case discussed with Dr. Chaney Malling.  Patient responded well to fluid repletion.  Patient's CBG was 125, and is not indicative of the cause of his increased thirst.  Workup is reassuring, his white count is only 6.3.  No elevation of lipase.  X-ray acute abdominal series without any obstructive gas pattern.  Patient requesting discharge with work note.  Patient given return precautions for any focal abdominal tenderness, intractable nausea or vomiting, or fever in the setting of abdominal pain, nausea or vomiting.  Patient is understanding and agrees with the plan of care.  This is a shared visit with Dr. Chaney Malling. Patient was independently evaluated by this attending physician. Attending physician consulted in evaluation and discharge management.  New Prescriptions New Prescriptions   No medications on file     Delia Chimes 09/21/17 1833    Charlynne Pander, MD 09/22/17 [phone]

## 2017-09-21 NOTE — Discharge Instructions (Signed)
Take tylenol, motrin for pain.   Take bentyl for cramps.   Stay out of work for 2 days as you have a stomach bug and is infectious.   See your doctor  Return to ER if you have worse abdominal pain, cramps, vomiting, fever.

## 2017-09-21 NOTE — ED Triage Notes (Signed)
Dry mouth. Nausea. Thirsty. Urinary frequency. Several soft stools last night. Denies pain.

## 2017-11-16 ENCOUNTER — Ambulatory Visit (HOSPITAL_COMMUNITY)
Admission: EM | Admit: 2017-11-16 | Discharge: 2017-11-16 | Disposition: A | Discharge status: Home / Self Care | Payer: Self-pay | Attending: Internal Medicine | Admitting: Internal Medicine

## 2017-11-16 ENCOUNTER — Other Ambulatory Visit: Payer: Self-pay

## 2017-11-16 ENCOUNTER — Encounter (HOSPITAL_COMMUNITY): Payer: Self-pay | Admitting: Emergency Medicine

## 2017-11-16 DIAGNOSIS — K047 Periapical abscess without sinus: Secondary | ICD-10-CM

## 2017-11-16 DIAGNOSIS — K029 Dental caries, unspecified: Secondary | ICD-10-CM

## 2017-11-16 MED ORDER — CLINDAMYCIN HCL 150 MG PO CAPS: 150.0000 mg | ORAL_CAPSULE | Freq: Four times a day (QID) | ORAL | 0 refills | Status: DC

## 2017-11-16 MED ORDER — CHLORHEXIDINE GLUCONATE 0.12 % MT SOLN: 15.0000 mL | Freq: Two times a day (BID) | OROMUCOSAL | 0 refills | Status: DC

## 2017-11-16 MED ORDER — NAPROXEN 500 MG PO TABS
500.0000 mg | ORAL_TABLET | Freq: Two times a day (BID) | ORAL | 0 refills | Status: DC
Start: 2017-11-16 — End: 2018-12-05

## 2017-11-16 NOTE — ED Provider Notes (Signed)
[facility]    CSN: [id number] Arrival date & time: 11/16/17  1610     History   Chief Complaint Chief Complaint  Patient presents with  . Oral Swelling    HPI Donald Fisher is a 26 y.o. male.   Who presents with left upper gum/tooth  pain x 1-2 days. He reports that he noticed some swelling of his left jaw today. No fever or chills. No dentes.       Past Medical History:  Diagnosis Date  . Asthma    hx of   . Bronchitis   . GERD (gastroesophageal reflux disease)   . Heart murmur    hx of   . Pneumonia    hx of     Patient Active Problem List   Diagnosis Date Noted  . Status post labral repair of shoulder 09/25/2016  . Asthma attack 09/15/2016  . Abdominal pain 09/15/2016    Past Surgical History:  Procedure Laterality Date  . SHOULDER ARTHROSCOPY WITH LABRAL REPAIR Right 07/21/2016   Procedure: RIGHT SHOULDER ARTHROSCOPY WITH LABRAL REPAIR;  Surgeon: Kathryne Hitchhristopher Y Blackman, MD;  Location: [facility];  Service: Orthopedics;  Laterality: Right;  Shoulder Block       Home Medications    Prior to Admission medications   Medication Sig Start Date End Date Taking? Authorizing Provider  albuterol (PROVENTIL HFA;VENTOLIN HFA) 108 (90 Base) MCG/ACT inhaler Inhale 2 puffs into the lungs every 6 (six) hours as needed for wheezing or shortness of breath. 09/18/16   Kathlen ModyAkula, Vijaya, MD  albuterol (PROVENTIL) (2.5 MG/3ML) 0.083% nebulizer solution Take 3 mLs (2.5 mg total) by nebulization every 2 (two) hours as needed for wheezing or shortness of breath. 09/18/16   Kathlen ModyAkula, Vijaya, MD  chlorhexidine (PERIDEX) 0.12 % solution Use as directed 15 mLs in the mouth or throat 2 (two) times daily. 11/16/17   Riki SheerYoung, Kiki Bivens G, PA-C  clindamycin (CLEOCIN) 150 MG capsule Take 1 capsule (150 mg total) by mouth 4 (four) times daily. 11/16/17   Riki SheerYoung, Jaylend Reiland G, PA-C  dextromethorphan-guaiFENesin (MUCINEX DM) 30-600 MG 12hr tablet Take 1 tablet by mouth 2 (two) times  daily. Patient not taking: Reported on 06/22/2017 09/18/16   Kathlen ModyAkula, Vijaya, MD  dicyclomine (BENTYL) 20 MG tablet Take 1 tablet (20 mg total) by mouth 2 (two) times daily as needed for spasms. 09/21/17   Charlynne PanderYao, David Hsienta, MD  ibuprofen (ADVIL,MOTRIN) 800 MG tablet Take 1 tablet (800 mg total) by mouth every 8 (eight) hours as needed. Patient not taking: Reported on 06/22/2017 02/15/17   Ward, Chase PicketJaime Pilcher, PA-C  naproxen (NAPROSYN) 500 MG tablet Take 1 tablet (500 mg total) by mouth 2 (two) times daily with a meal. 11/16/17   Barth Trella, Dillard CannonMichelle G, PA-C  oxyCODONE-acetaminophen (PERCOCET) 5-325 MG tablet Take 1-2 tablets by mouth every 4 (four) hours as needed. 06/22/17   Donnetta Hutchingook, Brian, MD  pantoprazole (PROTONIX) 40 MG tablet Take 1 tablet (40 mg total) by mouth daily. Patient not taking: Reported on 06/22/2017 09/19/16   Kathlen ModyAkula, Vijaya, MD  promethazine (PHENERGAN) 25 MG tablet Take 1 tablet (25 mg total) by mouth every 6 (six) hours as needed for nausea or vomiting. 06/22/17   Donnetta Hutchingook, Brian, MD    Family History Family History  Problem Relation Age of Onset  . Hypertension Mother   . Congestive Heart Failure Maternal Grandmother   . Hypertension Maternal Grandmother     Social History Social History   Tobacco Use  . Smoking status: Current  Every Day Smoker    Packs/day: 0.50    Years: 10.00    Pack years: 5.00    Types: Cigarettes  . Smokeless tobacco: Never Used  Substance Use Topics  . Alcohol use: Yes    Comment: occasionally  . Drug use: Yes    Types: Marijuana     Allergies   Penicillins; Shellfish allergy; and Betadine [povidone iodine]   Review of Systems Review of Systems  Constitutional: Negative for fever.     Physical Exam Triage Vital Signs ED Triage Vitals  Enc Vitals Group     BP 11/16/17 1632 138/66     Pulse Rate 11/16/17 1632 (!) 105     Resp 11/16/17 1632 16     Temp 11/16/17 1632 98.3 F (36.8 C)     Temp src --      SpO2 11/16/17 1632 100 %      Weight --      Height --      Head Circumference --      Peak Flow --      Pain Score 11/16/17 1633 8     Pain Loc --      Pain Edu? --      Excl. in GC? --    No data found.  Updated Vital Signs BP 138/66   Pulse (!) 105   Temp 98.3 F (36.8 C)   Resp 16   SpO2 100%   Visual Acuity Right Eye Distance:   Left Eye Distance:   Bilateral Distance:    Right Eye Near:   Left Eye Near:    Bilateral Near:     Physical Exam  Constitutional: He appears well-developed and well-nourished. No distress.  HENT:  Mouth/Throat: Oropharynx is clear and moist.  Poor dentes throughout exam. Erythema of left upper gum with local swelling, no frank abscess, broken upper tooth in this area, lower left gum with erythema and mild swelling, tenderness with palpation of tongue blade upper and lower gum line  Neck: Normal range of motion.  Lymphadenopathy:    He has no cervical adenopathy.  Neurological: He is alert.  Skin: Skin is warm and dry. He is not diaphoretic.  Psychiatric: His behavior is normal.  Nursing note and vitals reviewed.    UC Treatments / Results  Labs (all labs ordered are listed, but only abnormal results are displayed) Labs Reviewed - No data to display  EKG  EKG Interpretation None       Radiology No results found.  Procedures Procedures (including critical care time)  Medications Ordered in UC Medications - No data to display   Initial Impression / Assessment and Plan / UC Course  I have reviewed the triage vital signs and the nursing notes.  Pertinent labs & imaging results that were available during my care of the patient were reviewed by me and considered in my medical decision making (see chart for details).     Treat with peridex, abx therapy and NSAID's. No frank abscess and instructed to f/u with Dentist for proper care. No emergent needs, stable for discharge.  Final Clinical Impressions(s) / UC Diagnoses   Final diagnoses:  Tooth  decay  Tooth infection    ED Discharge Orders        Ordered    clindamycin (CLEOCIN) 150 MG capsule  4 times daily     11/16/17 1748    chlorhexidine (PERIDEX) 0.12 % solution  2 times daily     11/16/17 1749  naproxen (NAPROSYN) 500 MG tablet  2 times daily with meals     11/16/17 1749       Controlled Substance Prescriptions [place] Controlled Substance Registry consulted? Not Applicable   Sharin Mons 11/16/17 1758

## 2017-11-16 NOTE — Discharge Instructions (Signed)
You most likely have a tooth infection. Start antibiotics. May rinse with peridex for pain relief. Also use the Naprosyn every 12 hours as needed for pain. FU with a dentist.

## 2017-11-16 NOTE — ED Triage Notes (Signed)
Pt c/o swelling in his gums, makes the L side of his face hurt.

## 2017-12-04 ENCOUNTER — Emergency Department (HOSPITAL_BASED_OUTPATIENT_CLINIC_OR_DEPARTMENT_OTHER): Payer: Self-pay

## 2017-12-04 ENCOUNTER — Other Ambulatory Visit: Payer: Self-pay

## 2017-12-04 ENCOUNTER — Encounter (HOSPITAL_BASED_OUTPATIENT_CLINIC_OR_DEPARTMENT_OTHER): Payer: Self-pay | Admitting: Emergency Medicine

## 2017-12-04 DIAGNOSIS — Z79899 Other long term (current) drug therapy: Secondary | ICD-10-CM | POA: Insufficient documentation

## 2017-12-04 DIAGNOSIS — F17210 Nicotine dependence, cigarettes, uncomplicated: Secondary | ICD-10-CM | POA: Insufficient documentation

## 2017-12-04 DIAGNOSIS — M62830 Muscle spasm of back: Secondary | ICD-10-CM | POA: Insufficient documentation

## 2017-12-04 DIAGNOSIS — M546 Pain in thoracic spine: Secondary | ICD-10-CM | POA: Insufficient documentation

## 2017-12-04 NOTE — ED Triage Notes (Addendum)
PT presents with c/o right upper back pain for 2 days. PT states his pain is worse when taking a deep breath.

## 2017-12-05 ENCOUNTER — Emergency Department (HOSPITAL_BASED_OUTPATIENT_CLINIC_OR_DEPARTMENT_OTHER)
Admission: EM | Admit: 2017-12-05 | Discharge: 2017-12-05 | Disposition: A | Discharge status: Home / Self Care | Payer: Self-pay | Attending: Emergency Medicine | Admitting: Emergency Medicine

## 2017-12-05 DIAGNOSIS — M62830 Muscle spasm of back: Secondary | ICD-10-CM

## 2017-12-05 MED ORDER — NAPROXEN 375 MG PO TABS: 375.0000 mg | ORAL_TABLET | Freq: Two times a day (BID) | ORAL | 0 refills | Status: DC

## 2017-12-05 MED ORDER — METHOCARBAMOL 500 MG PO TABS: 500.0000 mg | ORAL_TABLET | Freq: Two times a day (BID) | ORAL | 0 refills | Status: DC

## 2017-12-05 MED ORDER — METHOCARBAMOL 500 MG PO TABS
1000.0000 mg | ORAL_TABLET | Freq: Once | ORAL | Status: AC
  Administered 2017-12-05: 1000 mg via ORAL
  Filled 2017-12-05: qty 2

## 2017-12-05 MED ORDER — ACETAMINOPHEN 500 MG PO TABS
1000.0000 mg | ORAL_TABLET | Freq: Once | ORAL | Status: AC
  Administered 2017-12-05: 1000 mg via ORAL
  Filled 2017-12-05: qty 2

## 2017-12-05 MED ORDER — NAPROXEN 250 MG PO TABS
500.0000 mg | ORAL_TABLET | Freq: Once | ORAL | Status: AC
  Administered 2017-12-05: 500 mg via ORAL
  Filled 2017-12-05: qty 2

## 2017-12-05 NOTE — ED Provider Notes (Addendum)
[facility] EMERGENCY DEPARTMENT Provider Note   CSN: [id number] Arrival date & time: 12/04/17  2235     History   Chief Complaint Chief Complaint  Patient presents with  . Back Pain    HPI Donald Fisher is a 27 y.o. male.  The history is provided by the patient.  Back Pain   This is a new problem. The current episode started 2 days ago. The problem occurs constantly. The problem has not changed since onset.The pain is associated with lifting heavy objects. The pain is present in the thoracic spine and lumbar spine (paraspinal). The quality of the pain is described as stabbing. The pain does not radiate. The pain is moderate. The symptoms are aggravated by bending, twisting and certain positions. The pain is the same all the time. Pertinent negatives include no chest pain, no fever, no numbness, no weight loss, no headaches, no abdominal pain, no abdominal swelling, no bowel incontinence, no perianal numbness, no bladder incontinence, no dysuria, no pelvic pain, no leg pain, no paresthesias, no paresis, no tingling and no weakness. He has tried nothing for the symptoms. The treatment provided no relief. Risk factors include obesity.  No surgeries, no leg pain or swelling no long car trips or plane trips.    Past Medical History:  Diagnosis Date  . Asthma    hx of   . Bronchitis   . GERD (gastroesophageal reflux disease)   . Heart murmur    hx of   . Pneumonia    hx of     Patient Active Problem List   Diagnosis Date Noted  . Status post labral repair of shoulder 09/25/2016  . Asthma attack 09/15/2016  . Abdominal pain 09/15/2016    Past Surgical History:  Procedure Laterality Date  . SHOULDER ARTHROSCOPY WITH LABRAL REPAIR Right 07/21/2016   Procedure: RIGHT SHOULDER ARTHROSCOPY WITH LABRAL REPAIR;  Surgeon: Kathryne Hitchhristopher Y Blackman, MD;  Location: [facility];  Service: Orthopedics;  Laterality: Right;  Shoulder Block       Home Medications    Prior to  Admission medications   Medication Sig Start Date End Date Taking? Authorizing Provider  albuterol (PROVENTIL HFA;VENTOLIN HFA) 108 (90 Base) MCG/ACT inhaler Inhale 2 puffs into the lungs every 6 (six) hours as needed for wheezing or shortness of breath. 09/18/16   Kathlen ModyAkula, Vijaya, MD  albuterol (PROVENTIL) (2.5 MG/3ML) 0.083% nebulizer solution Take 3 mLs (2.5 mg total) by nebulization every 2 (two) hours as needed for wheezing or shortness of breath. 09/18/16   Kathlen ModyAkula, Vijaya, MD  chlorhexidine (PERIDEX) 0.12 % solution Use as directed 15 mLs in the mouth or throat 2 (two) times daily. 11/16/17   Riki SheerYoung, Michelle G, PA-C  clindamycin (CLEOCIN) 150 MG capsule Take 1 capsule (150 mg total) by mouth 4 (four) times daily. 11/16/17   Riki SheerYoung, Michelle G, PA-C  dextromethorphan-guaiFENesin (MUCINEX DM) 30-600 MG 12hr tablet Take 1 tablet by mouth 2 (two) times daily. Patient not taking: Reported on 06/22/2017 09/18/16   Kathlen ModyAkula, Vijaya, MD  dicyclomine (BENTYL) 20 MG tablet Take 1 tablet (20 mg total) by mouth 2 (two) times daily as needed for spasms. 09/21/17   Charlynne PanderYao, David Hsienta, MD  ibuprofen (ADVIL,MOTRIN) 800 MG tablet Take 1 tablet (800 mg total) by mouth every 8 (eight) hours as needed. Patient not taking: Reported on 06/22/2017 02/15/17   Ward, Chase PicketJaime Pilcher, PA-C  naproxen (NAPROSYN) 500 MG tablet Take 1 tablet (500 mg total) by mouth 2 (two) times daily  with a meal. 11/16/17   Young, Dillard Cannon, PA-C  oxyCODONE-acetaminophen (PERCOCET) 5-325 MG tablet Take 1-2 tablets by mouth every 4 (four) hours as needed. 06/22/17   Donnetta Hutching, MD  pantoprazole (PROTONIX) 40 MG tablet Take 1 tablet (40 mg total) by mouth daily. Patient not taking: Reported on 06/22/2017 09/19/16   Kathlen Mody, MD  promethazine (PHENERGAN) 25 MG tablet Take 1 tablet (25 mg total) by mouth every 6 (six) hours as needed for nausea or vomiting. 06/22/17   Donnetta Hutching, MD    Family History Family History  Problem Relation Age of Onset    . Hypertension Mother   . Congestive Heart Failure Maternal Grandmother   . Hypertension Maternal Grandmother     Social History Social History   Tobacco Use  . Smoking status: Current Every Day Smoker    Packs/day: 0.50    Years: 10.00    Pack years: 5.00    Types: Cigarettes  . Smokeless tobacco: Never Used  Substance Use Topics  . Alcohol use: Yes    Comment: occasionally  . Drug use: Yes    Types: Marijuana     Allergies   Penicillins; Shellfish allergy; and Betadine [povidone iodine]   Review of Systems Review of Systems  Constitutional: Negative for fever and weight loss.  Respiratory: Negative for cough and shortness of breath.   Cardiovascular: Negative for chest pain, palpitations and leg swelling.  Gastrointestinal: Negative for abdominal pain and bowel incontinence.  Genitourinary: Negative for bladder incontinence, dysuria and pelvic pain.  Musculoskeletal: Positive for back pain.  Neurological: Negative for tingling, weakness, numbness, headaches and paresthesias.  All other systems reviewed and are negative.    Physical Exam Updated Vital Signs BP 119/72 (BP Location: Left Arm)   Pulse 82   Temp 97.7 F (36.5 C) (Oral)   Resp 19   SpO2 100%   Physical Exam  Constitutional: He is oriented to person, place, and time. He appears well-developed and well-nourished. No distress.  HENT:  Head: Normocephalic and atraumatic.  Mouth/Throat: No oropharyngeal exudate.  Eyes: Conjunctivae are normal. Pupils are equal, round, and reactive to light.  Neck: Normal range of motion. Neck supple.  Cardiovascular: Normal rate, regular rhythm, normal heart sounds and intact distal pulses.  Pulmonary/Chest: Effort normal and breath sounds normal. No stridor. No respiratory distress. He has no wheezes. He has no rales.  Abdominal: Soft. Bowel sounds are normal. He exhibits no mass. There is no tenderness. There is no rebound and no guarding. No hernia.   Musculoskeletal: Normal range of motion.       Arms: Neurological: He is alert and oriented to person, place, and time. He displays normal reflexes. Coordination normal.  Skin: Skin is warm and dry. Capillary refill takes less than 2 seconds.     ED Treatments / Results   Vitals:   12/04/17 2249  BP: 119/72  Pulse: 82  Resp: 19  Temp: 97.7 F (36.5 C)  SpO2: 100%    Radiology Dg Chest 2 View  Result Date: 12/04/2017 CLINICAL DATA:  Back pain when taking deep breath. EXAM: CHEST  2 VIEW COMPARISON:  09/21/2017 FINDINGS: Borderline cardiomegaly. No aortic aneurysm. Clear lungs without pneumothorax or pneumonic consolidations. No effusion. The visualized skeletal structures are unremarkable. IMPRESSION: Borderline cardiomegaly without active pulmonary disease. Electronically Signed   By: Tollie Eth M.D.   On: 12/04/2017 23:21    Procedures Procedures (including critical care time)  Medications Ordered in ED  Medications  naproxen (  NAPROSYN) tablet 500 mg (500 mg Oral Given 12/05/17 0121)  acetaminophen (TYLENOL) tablet 1,000 mg (1,000 mg Oral Given 12/05/17 0121)  methocarbamol (ROBAXIN) tablet 1,000 mg (1,000 mg Oral Given 12/05/17 0121)     PERC negative [place] 0 highly doubt PE in this very low risk patient   Final Clinical Impressions(s) / ED Diagnoses   Palpable spasm with reproducible MSK pain.  Exam is benign and reassuring.  Patient has now had 2 negative strep tests.  Return for worsening pain, weakness numbness, inability to walk, inability to pass urine, leakage of stool,  fevers > 100.4 unrelieved by medication,  intractable vomiting, or diarrhea, abdominal pain, Inability to tolerate liquids or food, cough, altered mental status or any concerns. No signs of systemic illness or infection. The patient is nontoxic-appearing on exam and vital signs are within normal limits.    I have reviewed the triage vital signs and the nursing notes. Pertinent labs &imaging  results that were available during my care of the patient were reviewed by me and considered in my medical decision making (see chart for details).  After history, exam, and medical workup I feel the patient has been appropriately medically screened and is safe for discharge home. Pertinent diagnoses were discussed with the patient. Patient was given return precautions    Pravin Perezperez, MD 12/05/17 Dorothyann Gibbs, Aaryav Hopfensperger, MD 12/05/17 4098    Nicanor Alcon, Ondine Gemme, MD 12/05/17 1191

## 2017-12-24 ENCOUNTER — Emergency Department (HOSPITAL_BASED_OUTPATIENT_CLINIC_OR_DEPARTMENT_OTHER)
Admission: EM | Admit: 2017-12-24 | Discharge: 2017-12-24 | Disposition: A | Discharge status: Home / Self Care | Payer: Self-pay | Attending: Emergency Medicine | Admitting: Emergency Medicine

## 2017-12-24 ENCOUNTER — Encounter (HOSPITAL_BASED_OUTPATIENT_CLINIC_OR_DEPARTMENT_OTHER): Payer: Self-pay | Admitting: *Deleted

## 2017-12-24 ENCOUNTER — Other Ambulatory Visit: Payer: Self-pay

## 2017-12-24 DIAGNOSIS — Z87891 Personal history of nicotine dependence: Secondary | ICD-10-CM | POA: Insufficient documentation

## 2017-12-24 DIAGNOSIS — J040 Acute laryngitis: Secondary | ICD-10-CM | POA: Insufficient documentation

## 2017-12-24 DIAGNOSIS — J45909 Unspecified asthma, uncomplicated: Secondary | ICD-10-CM | POA: Insufficient documentation

## 2017-12-24 DIAGNOSIS — R05 Cough: Secondary | ICD-10-CM | POA: Insufficient documentation

## 2017-12-24 DIAGNOSIS — F1210 Cannabis abuse, uncomplicated: Secondary | ICD-10-CM | POA: Insufficient documentation

## 2017-12-24 DIAGNOSIS — J069 Acute upper respiratory infection, unspecified: Secondary | ICD-10-CM | POA: Insufficient documentation

## 2017-12-24 LAB — RAPID STREP SCREEN (MED CTR MEBANE ONLY): Streptococcus, Group A Screen (Direct): NEGATIVE

## 2017-12-24 NOTE — ED Triage Notes (Signed)
Pt c/o sore throat x  X 1 week

## 2017-12-24 NOTE — Discharge Instructions (Signed)
You appear to have an upper respiratory infection (URI). An upper respiratory tract infection, or cold, is a viral infection of the air passages leading to the lungs. It is contagious and can be spread to others, especially during the first 3 or 4 days. It cannot be cured by antibiotics or other medicines. °RETURN IMMEDIATELY IF you develop shortness of breath, confusion or altered mental status, a new rash, become dizzy, faint, or poorly responsive, or are unable to be cared for at home. ° °

## 2017-12-24 NOTE — ED Provider Notes (Signed)
[facility] EMERGENCY DEPARTMENT Provider Note   CSN: [id number] Arrival date & time: 12/24/17  1345     History   Chief Complaint Chief Complaint  Patient presents with  . Sore Throat    HPI Donald Fisher is a 27 y.o. male.  Who presents emergency department chief complaint of URI symptoms.  He states that he has had symptoms for about 5 days.  He denies fevers, chills, wheezing.  He complains of sore throat, loss of voice, mild productive cough without pain.  He denies wheezing or shortness of breath.  He has not tried any medications for treatment of his symptoms.  He is able to tolerate fluid mouth.  HPI  Past Medical History:  Diagnosis Date  . Asthma    hx of   . Bronchitis   . GERD (gastroesophageal reflux disease)   . Heart murmur    hx of   . Pneumonia    hx of     Patient Active Problem List   Diagnosis Date Noted  . Status post labral repair of shoulder 09/25/2016  . Asthma attack 09/15/2016  . Abdominal pain 09/15/2016    Past Surgical History:  Procedure Laterality Date  . SHOULDER ARTHROSCOPY WITH LABRAL REPAIR Right 07/21/2016   Procedure: RIGHT SHOULDER ARTHROSCOPY WITH LABRAL REPAIR;  Surgeon: Kathryne Hitchhristopher Y Blackman, MD;  Location: [facility];  Service: Orthopedics;  Laterality: Right;  Shoulder Block       Home Medications    Prior to Admission medications   Medication Sig Start Date End Date Taking? Authorizing Provider  albuterol (PROVENTIL HFA;VENTOLIN HFA) 108 (90 Base) MCG/ACT inhaler Inhale 2 puffs into the lungs every 6 (six) hours as needed for wheezing or shortness of breath. 09/18/16   Kathlen ModyAkula, Vijaya, MD  albuterol (PROVENTIL) (2.5 MG/3ML) 0.083% nebulizer solution Take 3 mLs (2.5 mg total) by nebulization every 2 (two) hours as needed for wheezing or shortness of breath. 09/18/16   Kathlen ModyAkula, Vijaya, MD  chlorhexidine (PERIDEX) 0.12 % solution Use as directed 15 mLs in the mouth or throat 2 (two) times daily. 11/16/17   Riki SheerYoung,  Michelle G, PA-C  dicyclomine (BENTYL) 20 MG tablet Take 1 tablet (20 mg total) by mouth 2 (two) times daily as needed for spasms. 09/21/17   Charlynne PanderYao, David Hsienta, MD  ibuprofen (ADVIL,MOTRIN) 800 MG tablet Take 1 tablet (800 mg total) by mouth every 8 (eight) hours as needed. Patient not taking: Reported on 06/22/2017 02/15/17   Ward, Chase PicketJaime Pilcher, PA-C  naproxen (NAPROSYN) 375 MG tablet Take 1 tablet (375 mg total) by mouth 2 (two) times daily with a meal. 12/05/17   Palumbo, April, MD  naproxen (NAPROSYN) 500 MG tablet Take 1 tablet (500 mg total) by mouth 2 (two) times daily with a meal. 11/16/17   Young, Dillard CannonMichelle G, PA-C  oxyCODONE-acetaminophen (PERCOCET) 5-325 MG tablet Take 1-2 tablets by mouth every 4 (four) hours as needed. 06/22/17   Donnetta Hutchingook, Brian, MD    Family History Family History  Problem Relation Age of Onset  . Hypertension Mother   . Congestive Heart Failure Maternal Grandmother   . Hypertension Maternal Grandmother     Social History Social History   Tobacco Use  . Smoking status: Former Smoker    Packs/day: 0.50    Years: 10.00    Pack years: 5.00    Types: Cigarettes  . Smokeless tobacco: Never Used  Substance Use Topics  . Alcohol use: Yes    Comment: occasionally  . Drug  use: Yes    Types: Marijuana     Allergies   Penicillins; Shellfish allergy; and Betadine [povidone iodine]   Review of Systems Review of Systems Ten systems reviewed and are negative for acute change, except as noted in the HPI.    Physical Exam Updated Vital Signs BP 129/81   Pulse 93   Temp 98.2 F (36.8 C)   Resp 20   Ht 5\' 7"  (1.702 m)   Wt 104.3 kg (230 lb)   SpO2 100%   BMI 36.02 kg/m   Physical Exam  Constitutional: He appears well-developed and well-nourished. No distress.  Patient does not appear ill  HENT:  Head: Normocephalic and atraumatic.  Right Ear: Hearing and tympanic membrane normal.  Left Ear: Hearing and tympanic membrane normal.  Mouth/Throat: Uvula  is midline and mucous membranes are normal. No uvula swelling. Posterior oropharyngeal edema and posterior oropharyngeal erythema present. No oropharyngeal exudate. No tonsillar exudate.  Eyes: Conjunctivae and EOM are normal. Pupils are equal, round, and reactive to light. No scleral icterus.  Neck: Normal range of motion. Neck supple.  Cardiovascular: Normal rate, regular rhythm and normal heart sounds.  Pulmonary/Chest: Effort normal and breath sounds normal. No respiratory distress.  Abdominal: Soft. There is no tenderness.  Musculoskeletal: He exhibits no edema.  Neurological: He is alert.  Skin: Skin is warm and dry. Capillary refill takes less than 2 seconds. He is not diaphoretic.  Psychiatric: His behavior is normal.  Nursing note and vitals reviewed.    ED Treatments / Results  Labs (all labs ordered are listed, but only abnormal results are displayed) Labs Reviewed  RAPID STREP SCREEN (NOT AT [facility])  CULTURE, GROUP A STREP [facility])    EKG  EKG Interpretation None       Radiology No results found.  Procedures Procedures (including critical care time)  Medications Ordered in ED Medications - No data to display   Initial Impression / Assessment and Plan / ED Course  I have reviewed the triage vital signs and the nursing notes.  Pertinent labs & imaging results that were available during my care of the patient were reviewed by me and considered in my medical decision making (see chart for details).   . Patients symptoms are consistent with URI, likely viral etiology. Discussed that antibiotics are not indicated for viral infections. Pt will be discharged with symptomatic treatment.  Verbalizes understanding and is agreeable with plan. Pt is hemodynamically stable & in NAD prior to dc.  Final Clinical Impressions(s) / ED Diagnoses   Final diagnoses:  Upper respiratory tract infection, unspecified type  Laryngitis    ED Discharge Orders    None         Arthor Captain, PA-C 12/24/17 1712    Vanetta Mulders, MD 12/26/17 1714

## 2017-12-27 LAB — CULTURE, GROUP A STREP (THRC)

## 2018-09-24 IMAGING — US US ART/VEN ABD/PELV/SCROTUM DOPPLER LTD
1 series · 13 of 25 positions shown · non-contrast
Comparison: None.

CLINICAL DATA: Right testicular pain x3 hours

EXAM:
SCROTAL ULTRASOUND
DOPPLER ULTRASOUND OF THE TESTICLES
TECHNIQUE: Complete ultrasound examination of the testicles, epididymis, and
other scrotal structures was performed. Color and spectral Doppler
ultrasound were also utilized to evaluate blood flow to the
testicles.

[Series 1: us art/ven abd/pelv/scrotum doppler ltd · 0.06mm/px · 13 of 56 slices shown]
[im 1/56]
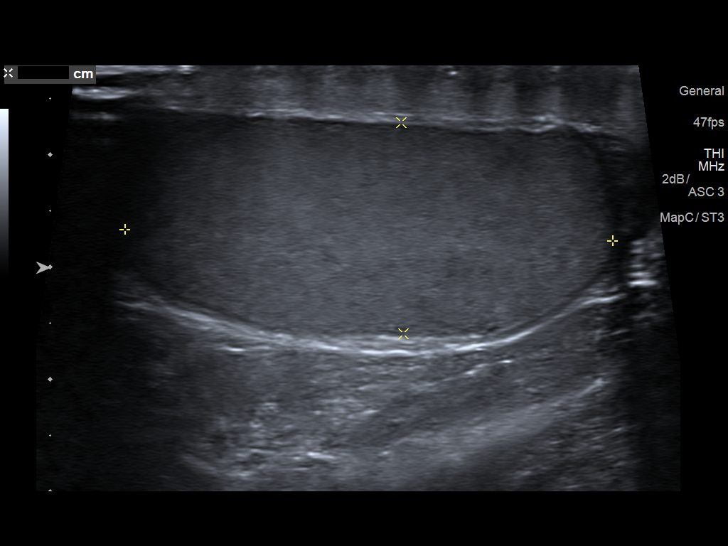
[im 5/56]
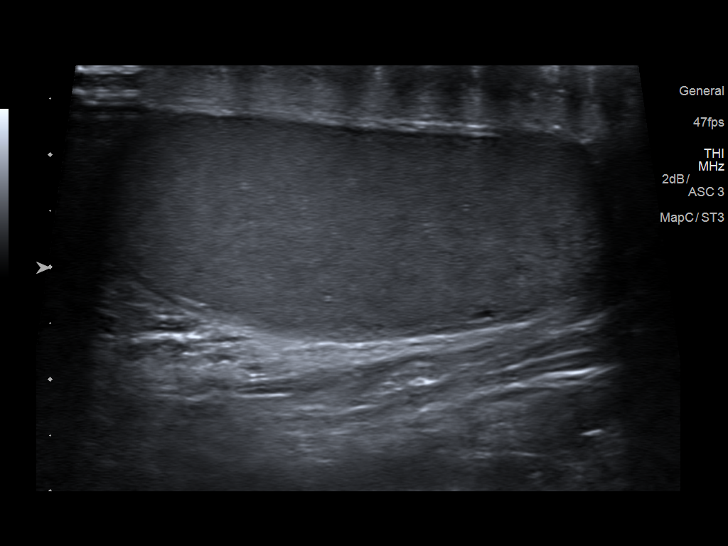
[im 10/56]
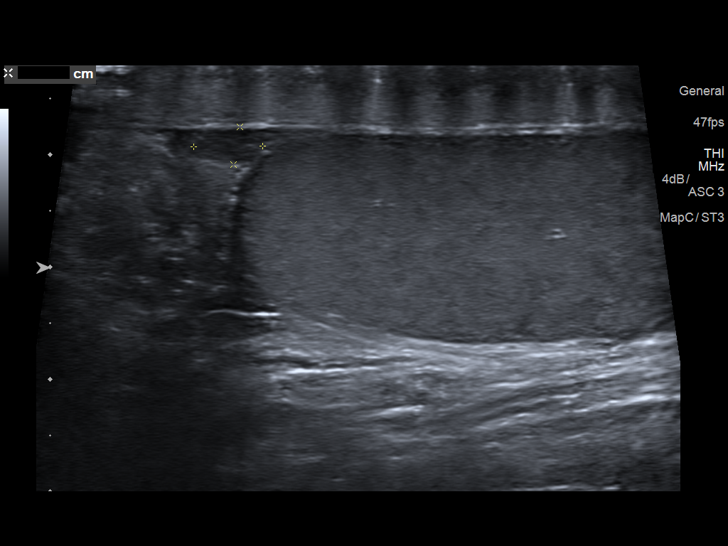
[im 14/56]
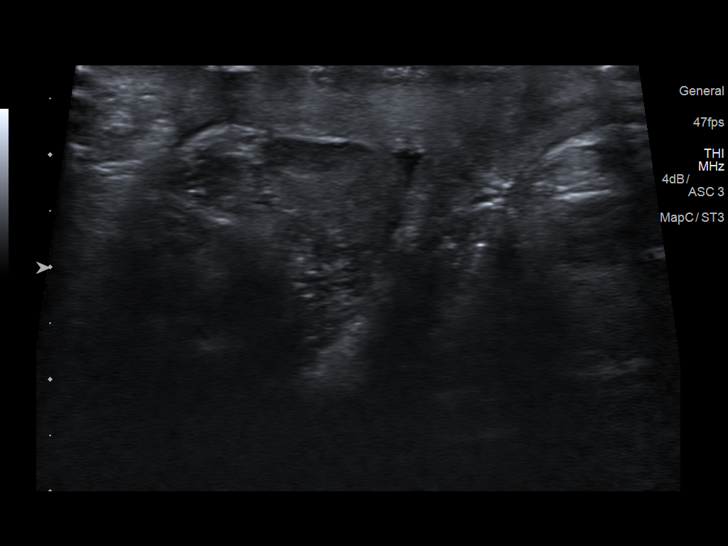
[im 19/56]
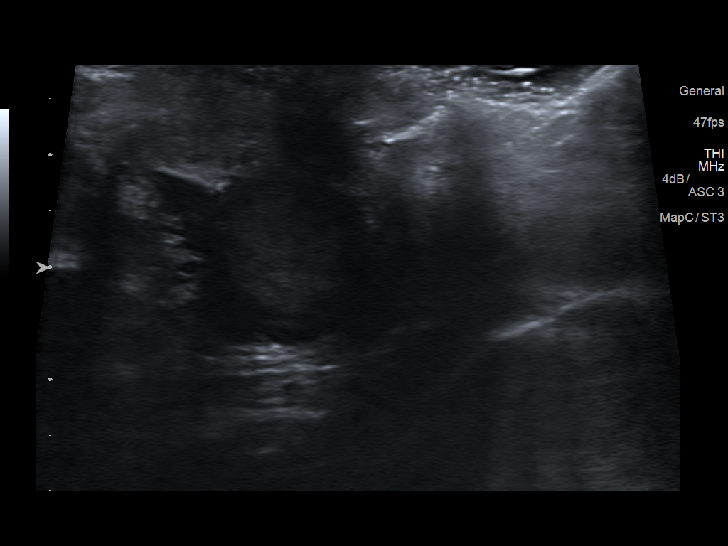
[im 23/56]
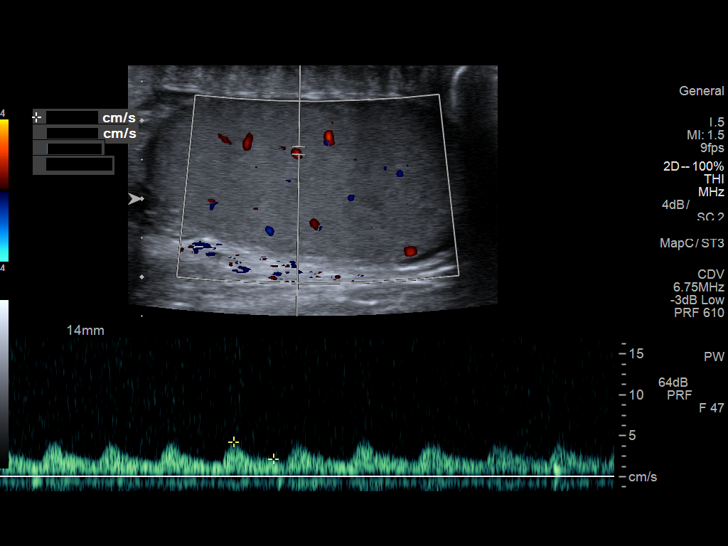
[im 28/56]
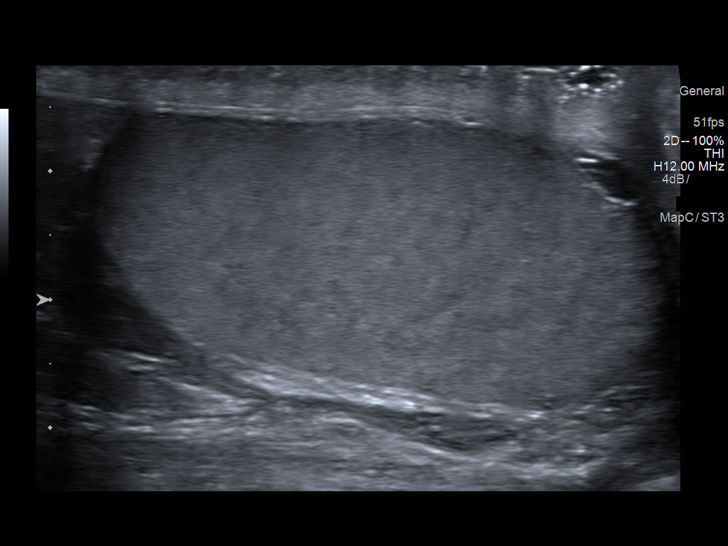
[im 33/56]
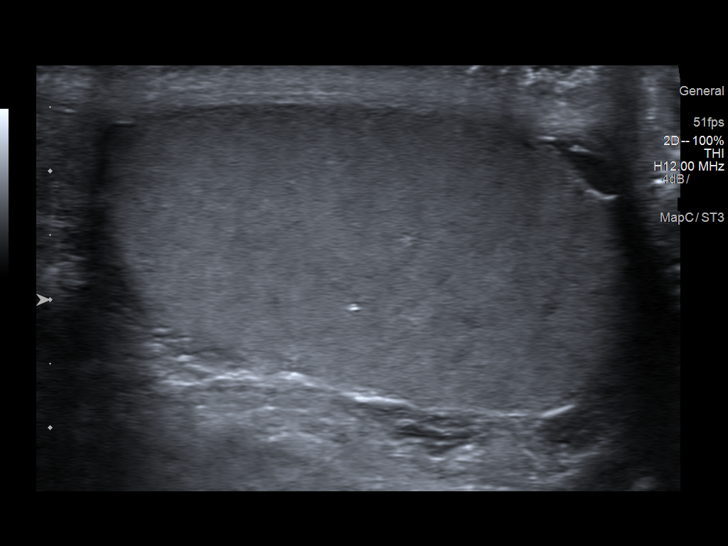
[im 37/56]
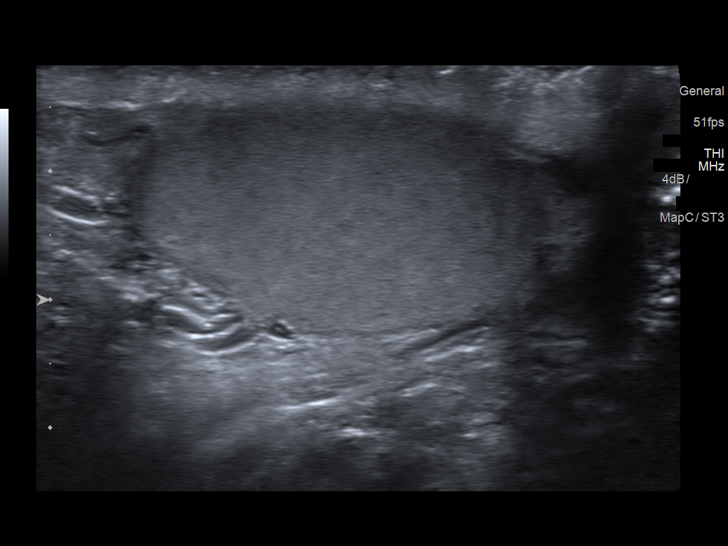
[im 42/56]
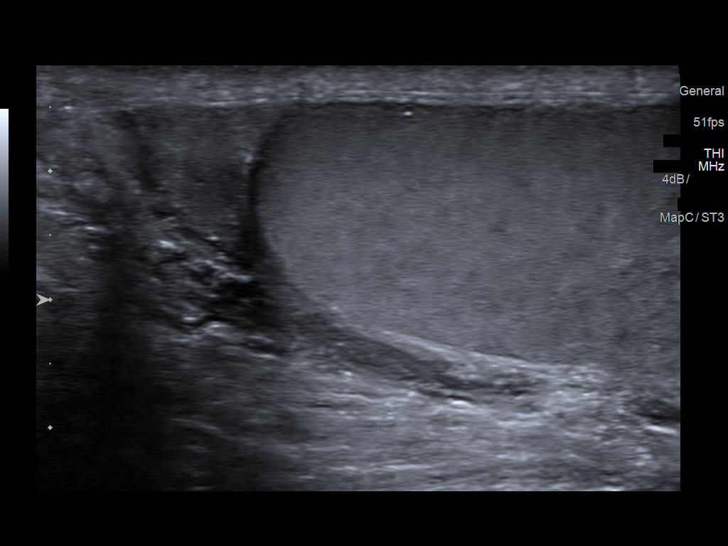
[im 46/56]
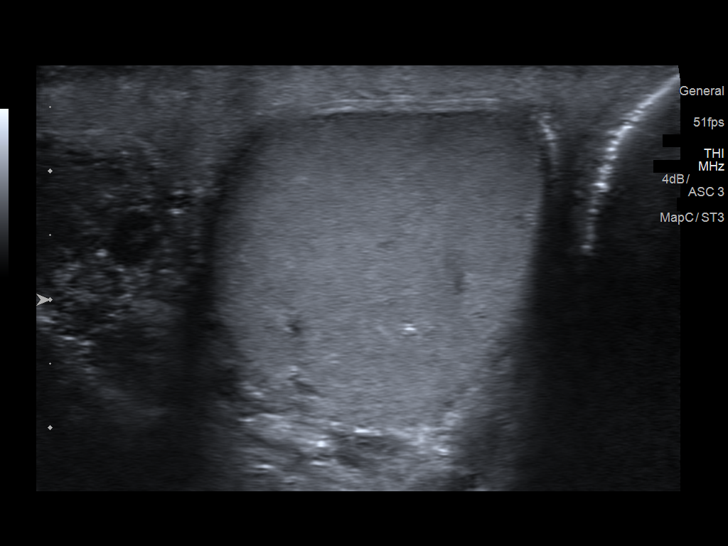
[im 51/56]
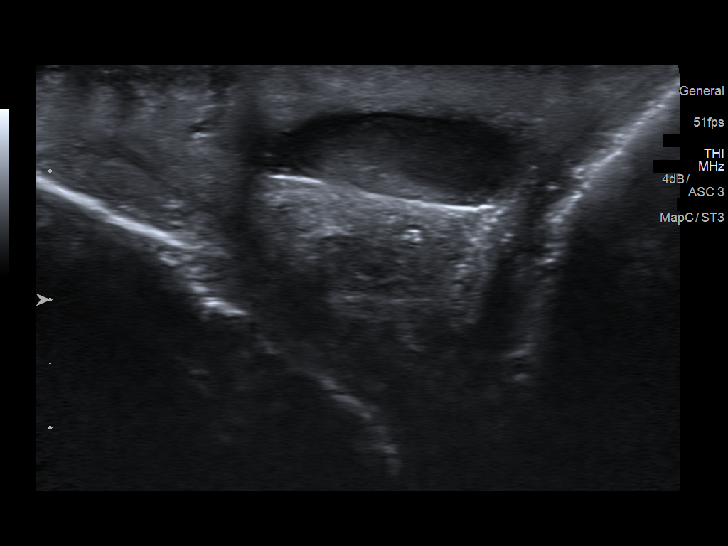
[im 56/56]
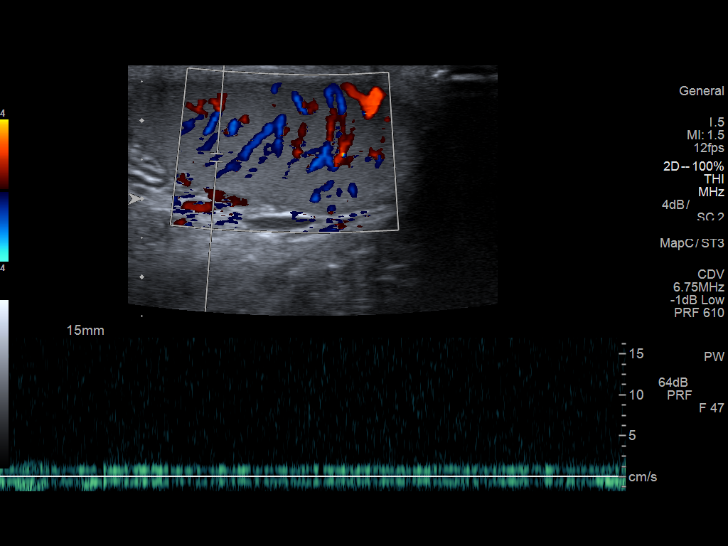

[13 of 25 positions shown; findings below may reference images not displayed]

FINDINGS: Right testicle

Measurements: 4.3 x 1.9 x 2.5 cm. No mass or microlithiasis
visualized.

Left testicle

Measurements: 4.5 x 2.1 x 2.7 cm. No mass noted. A few microliths
are present within.

Right epididymis: Normal in size with minimal heterogeneity noted.
There is a nonspecific well-circumscribed hypoechoic 0.6 x 0.3 x
cm focus along the anterior aspect of the epididymal head possibly a
tiny spermatocele or potentially stigmata of old remote
epididymitis.

Left epididymis:  Normal in size and appearance.

Hydrocele: Trace amount of fluid in the scrotum bilaterally, likely
physiologic.

Varicocele:  None visualized.

Pulsed Doppler interrogation of both testes demonstrates normal low
resistance arterial and venous waveforms bilaterally.
IMPRESSION: 1. No evidence of testicular mass or torsion.
2. Tiny hypoechoic nonspecific focus in the right epididymal head
measuring 0.6 x 0.3 x 0.8 cm that may represent a small spermatocele
or stigmata of old remote epididymitis.
3. Minimal left-sided testicular microlithiasis. Current literature
suggests that testicular microlithiasis is not a significant
independent risk factor for development of testicular carcinoma, and
that follow up imaging is not warranted in the absence of other risk
factors. Monthly testicular self-examination and annual physical
exams are considered appropriate surveillance. If patient has other
risk factors for testicular carcinoma, then referral to Urology
should be considered. (Reference: Danh, et al.: A 5-Year Follow
up Study of Asymptomatic Men with Testicular Microlithiasis. J Urol
1999; 179:5624-5620.) .

## 2018-10-05 ENCOUNTER — Emergency Department (HOSPITAL_BASED_OUTPATIENT_CLINIC_OR_DEPARTMENT_OTHER)
Admission: EM | Admit: 2018-10-05 | Discharge: 2018-10-05 | Disposition: A | Discharge status: Home / Self Care | Payer: Self-pay | Attending: Emergency Medicine | Admitting: Emergency Medicine

## 2018-10-05 ENCOUNTER — Other Ambulatory Visit: Payer: Self-pay

## 2018-10-05 ENCOUNTER — Encounter (HOSPITAL_BASED_OUTPATIENT_CLINIC_OR_DEPARTMENT_OTHER): Payer: Self-pay | Admitting: Emergency Medicine

## 2018-10-05 DIAGNOSIS — L0201 Cutaneous abscess of face: Secondary | ICD-10-CM | POA: Insufficient documentation

## 2018-10-05 DIAGNOSIS — L0291 Cutaneous abscess, unspecified: Secondary | ICD-10-CM

## 2018-10-05 DIAGNOSIS — Z23 Encounter for immunization: Secondary | ICD-10-CM | POA: Insufficient documentation

## 2018-10-05 LAB — CBG MONITORING, ED: Glucose-Capillary: 88 mg/dL (ref 70–99)

## 2018-10-05 MED ORDER — OXYCODONE-ACETAMINOPHEN 5-325 MG PO TABS
1.0000 | ORAL_TABLET | Freq: Once | ORAL | Status: AC
  Administered 2018-10-05: 1 via ORAL
  Filled 2018-10-05: qty 1

## 2018-10-05 MED ORDER — DOXYCYCLINE HYCLATE 100 MG PO CAPS: 100.0000 mg | ORAL_CAPSULE | Freq: Two times a day (BID) | ORAL | 0 refills | Status: AC

## 2018-10-05 MED ORDER — FENTANYL CITRATE (PF) 100 MCG/2ML IJ SOLN
100.0000 ug | Freq: Once | INTRAMUSCULAR | Status: DC
  Filled 2018-10-05: qty 2

## 2018-10-05 MED ORDER — FENTANYL CITRATE (PF) 100 MCG/2ML IJ SOLN
100.0000 ug | Freq: Once | INTRAMUSCULAR | Status: AC
  Administered 2018-10-05: 100 ug via INTRAVENOUS

## 2018-10-05 MED ORDER — OXYCODONE-ACETAMINOPHEN 5-325 MG PO TABS: 1.0000 | ORAL_TABLET | Freq: Four times a day (QID) | ORAL | 0 refills | Status: DC | PRN

## 2018-10-05 MED ORDER — TETANUS-DIPHTH-ACELL PERTUSSIS 5-2.5-18.5 LF-MCG/0.5 IM SUSP
0.5000 mL | Freq: Once | INTRAMUSCULAR | Status: AC
  Administered 2018-10-05: 0.5 mL via INTRAMUSCULAR
  Filled 2018-10-05: qty 0.5

## 2018-10-05 MED ORDER — SODIUM CHLORIDE 0.9 % IV BOLUS
1000.0000 mL | Freq: Once | INTRAVENOUS | Status: AC
  Administered 2018-10-05: 1000 mL via INTRAVENOUS

## 2018-10-05 MED ORDER — DOXYCYCLINE HYCLATE 100 MG PO TABS
100.0000 mg | ORAL_TABLET | Freq: Once | ORAL | Status: AC
  Administered 2018-10-05: 100 mg via ORAL
  Filled 2018-10-05: qty 1

## 2018-10-05 MED ORDER — LIDOCAINE-EPINEPHRINE (PF) 2 %-1:200000 IJ SOLN
10.0000 mL | Freq: Once | INTRAMUSCULAR | Status: AC
  Administered 2018-10-05: 10 mL
  Filled 2018-10-05 (×2): qty 10

## 2018-10-05 NOTE — ED Notes (Signed)
Pt called out concerned about the swelling in his forehead moving in the the bridge of the nose. RN notified provider

## 2018-10-05 NOTE — ED Provider Notes (Signed)
[facility] EMERGENCY DEPARTMENT Provider Note   CSN: [id number] Arrival date & time: 10/05/18  1346     History   Chief Complaint Chief Complaint  Patient presents with  . Abscess    HPI Donald Fisher is a 27 y.o. male with past medical history of abscesses, GERD, asthma, who presents today for evaluation of swelling on his forehead.  He reports that he first noticed this a few days ago and it has become significantly more swollen, painful, and red mostly over the past 2 days.  He denies any fevers, nausea vomiting or diarrhea.  Reports that he has these frequently however they go down and never get this big or require intervention.  Arrival complaint lists dizziness, however patient did not voice to me, when asked about concerns other than his abscess he declined any.   HPI  Past Medical History:  Diagnosis Date  . Asthma    hx of   . Bronchitis   . GERD (gastroesophageal reflux disease)   . Heart murmur    hx of   . Pneumonia    hx of     Patient Active Problem List   Diagnosis Date Noted  . Status post labral repair of shoulder 09/25/2016  . Asthma attack 09/15/2016  . Abdominal pain 09/15/2016    Past Surgical History:  Procedure Laterality Date  . SHOULDER ARTHROSCOPY WITH LABRAL REPAIR Right 07/21/2016   Procedure: RIGHT SHOULDER ARTHROSCOPY WITH LABRAL REPAIR;  Surgeon: Kathryne Hitch, MD;  Location: [facility];  Service: Orthopedics;  Laterality: Right;  Shoulder Block        Home Medications    Prior to Admission medications   Medication Sig Start Date End Date Taking? Authorizing Provider  albuterol (PROVENTIL HFA;VENTOLIN HFA) 108 (90 Base) MCG/ACT inhaler Inhale 2 puffs into the lungs every 6 (six) hours as needed for wheezing or shortness of breath. 09/18/16   Kathlen Mody, MD  albuterol (PROVENTIL) (2.5 MG/3ML) 0.083% nebulizer solution Take 3 mLs (2.5 mg total) by nebulization every 2 (two) hours as needed for wheezing or  shortness of breath. 09/18/16   Kathlen Mody, MD  chlorhexidine (PERIDEX) 0.12 % solution Use as directed 15 mLs in the mouth or throat 2 (two) times daily. 11/16/17   Riki Sheer, PA-C  dicyclomine (BENTYL) 20 MG tablet Take 1 tablet (20 mg total) by mouth 2 (two) times daily as needed for spasms. 09/21/17   Charlynne Pander, MD  doxycycline (VIBRAMYCIN) 100 MG capsule Take 1 capsule (100 mg total) by mouth 2 (two) times daily for 7 days. 10/05/18 10/12/18  Cristina Gong, PA-C  ibuprofen (ADVIL,MOTRIN) 800 MG tablet Take 1 tablet (800 mg total) by mouth every 8 (eight) hours as needed. Patient not taking: Reported on 06/22/2017 02/15/17   Ward, Chase Picket, PA-C  naproxen (NAPROSYN) 375 MG tablet Take 1 tablet (375 mg total) by mouth 2 (two) times daily with a meal. 12/05/17   Palumbo, April, MD  naproxen (NAPROSYN) 500 MG tablet Take 1 tablet (500 mg total) by mouth 2 (two) times daily with a meal. 11/16/17   Young, Dillard Cannon, PA-C  oxyCODONE-acetaminophen (PERCOCET/ROXICET) 5-325 MG tablet Take 1 tablet by mouth every 6 (six) hours as needed for severe pain. 10/05/18   Cristina Gong, PA-C    Family History Family History  Problem Relation Age of Onset  . Hypertension Mother   . Congestive Heart Failure Maternal Grandmother   . Hypertension Maternal Grandmother  Social History Social History   Tobacco Use  . Smoking status: Former Smoker    Packs/day: 0.50    Years: 10.00    Pack years: 5.00    Types: Cigarettes  . Smokeless tobacco: Never Used  Substance Use Topics  . Alcohol use: Yes    Comment: occasionally  . Drug use: Yes    Types: Marijuana     Allergies   Penicillins; Shellfish allergy; and Betadine [povidone iodine]   Review of Systems Review of Systems  Constitutional: Negative for chills and fever.  HENT: Positive for facial swelling. Negative for congestion and sore throat.   All other systems reviewed and are negative.    Physical  Exam Updated Vital Signs BP (!) 155/95   Pulse 88   Temp 98.3 F (36.8 C) (Oral)   Resp 17   Ht 5\' 7"  (1.702 m)   Wt 102.1 kg   SpO2 100%   BMI 35.24 kg/m   Physical Exam  Constitutional: He is oriented to person, place, and time. He appears well-developed and well-nourished. No distress.  HENT:  4x7 cm area of swelling on forehead, slightly more left than in center.  The middle of this area is red and fluctuant, approx 2cm in diameter.  The area is TTP.    Eyes: Conjunctivae are normal. Right eye exhibits no discharge. Left eye exhibits no discharge. No scleral icterus.  Neck: Normal range of motion. Neck supple.  Cardiovascular: Normal rate and regular rhythm.  Pulmonary/Chest: Effort normal. No stridor. No respiratory distress.  Abdominal: He exhibits no distension.  Musculoskeletal: He exhibits no edema or deformity.  Neurological: He is alert and oriented to person, place, and time. He exhibits normal muscle tone.  Skin: Skin is warm and dry. He is not diaphoretic.  Psychiatric: He has a normal mood and affect. His behavior is normal.  Nursing note and vitals reviewed.      ED Treatments / Results  Labs (all labs ordered are listed, but only abnormal results are displayed) Labs Reviewed  CBG MONITORING, ED    EKG None  Radiology No results found.  Procedures .Marland KitchenIncision and Drainage Date/Time: 10/05/2018 4:26 PM Performed by: Cristina Gong, PA-C Authorized by: Cristina Gong, PA-C   Consent:    Consent obtained:  Written   Consent given by:  Patient   Risks discussed:  Bleeding, incomplete drainage, pain and infection (Damage to other structures, need for additional procedures, perment nerve damage)   Alternatives discussed:  No treatment, alternative treatment and referral Location:    Type:  Abscess   Size:  2x2cm   Location:  Head   Head location:  Face Pre-procedure details:    Skin preparation:  Chloraprep Anesthesia (see MAR for  exact dosages):    Anesthesia method:  Local infiltration (Aspiration performed prior to injection.  Incrimental injection)   Local anesthetic:  Lidocaine 2% WITH epi Procedure type:    Complexity:  Complex Procedure details:    Incision types:  Stab incision   Incision depth:  Subcutaneous   Scalpel blade:  11   Wound management:  Probed and deloculated and irrigated with saline   Drainage:  Purulent (Thick, chunky)   Drainage amount:  Moderate   Wound treatment:  Wound left open   Packing materials:  None Post-procedure details:    Patient tolerance of procedure:  Tolerated with difficulty Comments:     Patient became lightheaded during the procedure.  Procedure was stopped and he was laid back  with the foot of the bed elevated.  He did not fully pass out or lose consciousness.  He requested to continue the procedure.    EMERGENCY DEPARTMENT US SOFT TISSUE INTERPRETATION "Study: Limited Soft Tissue Ultrasound"  INDICATIONS: Pain and Soft tissue infection Multiple views of the body part were obtained in real-time with a multi-frequency linear probe  PERFORMED BY: Myself IMAGES ARCHIVED?: Yes SIDE:Midline BODY PART:Forehead INTERPRETATION:  Abcess present and Cellulitis present  (including critical care time)  Medications Ordered in ED Medications  lidocaine-EPINEPHrine (XYLOCAINE W/EPI) 2 %-1:200000 (PF) injection 10 mL (10 mLs Infiltration Given by Other 10/05/18 1536)  Tdap (BOOSTRIX) injection 0.5 mL (0.5 mLs Intramuscular Given 10/05/18 1539)  oxyCODONE-acetaminophen (PERCOCET/ROXICET) 5-325 MG per tablet 1 tablet (1 tablet Oral Given 10/05/18 1536)  sodium chloride 0.9 % bolus 1,000 mL (0 mLs Intravenous Stopped 10/05/18 1745)  fentaNYL (SUBLIMAZE) injection 100 mcg (100 mcg Intravenous Given 10/05/18 1710)  oxyCODONE-acetaminophen (PERCOCET/ROXICET) 5-325 MG per tablet 1 tablet (1 tablet Oral Given 10/05/18 2025)  doxycycline (VIBRA-TABS) tablet 100 mg (100 mg Oral Given  10/05/18 2025)     Initial Impression / Assessment and Plan / ED Course  I have reviewed the triage vital signs and the nursing notes.  Pertinent labs & imaging results that were available during my care of the patient were reviewed by me and considered in my medical decision making (see chart for details).  Clinical Course as of Oct 07 7  Sat Oct 05, 2018  1706 Was called back into patient's room as patient was stating that his headache was burning.  Upon entering patient appeared very anxious.  He says that the numbing medicine has worn off and his head is really hurting.  He is very anxious, tearful, and crying.  Intermittently hyperventilating.  Vitals were checked, he is not hypoxic.  He was initially mildly tachycardic and diaphoretic, however this subdued after he was given an ice pack for his forehead.  He continued to remain very anxious, does not feel short of breath however he was frequently holding his breath.  His visitor reports that he has not eaten today, sugar was checked and was 88.  He was coached to slow down his breathing with intermittent success.  Saline lock was ordered, additional pain medicine, and 1 L of IV fluids.  Suspect that this is primarily a vagal response to pain combined with anxiety.  We will continue to monitor.  He is placed on cardiac and continuous pulse ox monitoring.   [EH]  1710 Discussed patient with Dr. Donnald Garre who agrees with pain meds, fluids, observation.    [EH]  2021 Patient was reevaluated, as expected his head has also swollen after the procedure.  No fluctuance or bloody drainage at this time.  Patient continues to appear very anxious, however says he is ready to go home.  Discharged with his visitor who says she will spend the night with him.   [EH]    Clinical Course User Index [EH] Cristina Gong, PA-C   Patient with skin abscess amenable to incision and drainage.  Abscess was not large enough to warrant packing or drain,  wound  recheck in 2 days. Encouraged home warm soaks and flushing.  Mild signs of cellulitis is surrounding skin.   Given facial location will give antibiotics, had extensive discussion with patient about risk of drainage and he give written and verbal consent.  Patient got light headed during procedure.  Prior to discharge patient started reacting in pain  saying his head was burning.  His vitals remained stable.  IV was established, he was given fentanyl and fluids.  His pain was also treated with percocet. He appeared very anxious during this time.  He was observed in the emergency room for multiple hours after.  His vitals remained stable the entire time in the ER.  He was anxious about the swelling re-accumulating after the procedure and it was explained that it was initially reduced due to the pressure from expelling the purulent material and was not concerning.  Not consistent with hematoma, scant blood was present during drainage, and he did not have bleeding from the incision site.    This patient was seen as a shared visit with Dr. Donnald Garre.    Given rx for pain medication, after PMP was checked, and antibiotics.  Patient was able to ambulate out of the department in no distress with out difficulty.  Return precautions were discussed with patient who states their understanding.  At the time of discharge patient denied any unaddressed complaints or concerns.  Patient is agreeable for discharge home.   Final Clinical Impressions(s) / ED Diagnoses   Final diagnoses:  Abscess    ED Discharge Orders         Ordered    oxyCODONE-acetaminophen (PERCOCET/ROXICET) 5-325 MG tablet  Every 6 hours PRN     10/05/18 2027    doxycycline (VIBRAMYCIN) 100 MG capsule  2 times daily     10/05/18 2027           Cristina Gong, PA-C 10/06/18 Claiborne Billings, MD 10/07/18 [phone]

## 2018-10-05 NOTE — ED Notes (Signed)
ED Provider at bedside. 

## 2018-10-05 NOTE — ED Triage Notes (Signed)
Hard swollen knot to forehead. Denies pain.

## 2018-10-05 NOTE — ED Notes (Signed)
Pt alert, appears more comfortable. Voices concern that area is more swollen. Jeraldine Loots, [place] made aware

## 2018-10-05 NOTE — ED Notes (Signed)
ED Provider at bedside. Hammond, PA 

## 2018-10-05 NOTE — ED Notes (Addendum)
ED Provider at bedside. 

## 2018-10-05 NOTE — ED Notes (Signed)
Pt c/o sudden burning at location of I&D. Restless and crying. Pa notified. Ice applied per [facility]

## 2018-10-05 NOTE — ED Notes (Signed)
Pt and family understood dc material. NAD noted. Scripts given at dc 

## 2018-10-05 NOTE — Discharge Instructions (Addendum)
Please take Ibuprofen (Advil, motrin) and Tylenol (acetaminophen) to relieve your pain.  You may take up to 600 MG (3 pills) of normal strength ibuprofen every 8 hours as needed.  In between doses of ibuprofen you make take tylenol, up to 1,000 mg (two extra strength pills).  Do not take more than 3,000 mg tylenol in a 24 hour period.  Please check all medication labels as many medications such as pain and cold medications may contain tylenol.  Do not drink alcohol while taking these medications.  Do not take other NSAID'S while taking ibuprofen (such as aleve or naproxen).  Please take ibuprofen with food to decrease stomach upset.  You may have diarrhea from the antibiotics.  It is very important that you continue to take the antibiotics even if you get diarrhea unless a medical professional tells you that you may stop taking them.  If you stop too early the bacteria you are being treated for will become stronger and you may need different, more powerful antibiotics that have more side effects and worsening diarrhea.  Please stay well hydrated and consider probiotics as they may decrease the severity of your diarrhea.   Today you received medications that may make you sleepy or impair your ability to make decisions.  For the next 24 hours please do not drive, operate heavy machinery, care for a small child with out another adult present, or perform any activities that may cause harm to you or someone else if you were to fall asleep or be impaired.   You are being prescribed a medication which may make you sleepy. Please follow up of listed precautions for at least 24 hours after taking one dose.  

## 2018-10-05 NOTE — ED Notes (Signed)
Provider notified that patient was requesting an update

## 2018-10-05 NOTE — ED Notes (Signed)
Ice pack was given for comfort and to help with swelling

## 2018-12-03 ENCOUNTER — Emergency Department (HOSPITAL_BASED_OUTPATIENT_CLINIC_OR_DEPARTMENT_OTHER)
Admission: EM | Admit: 2018-12-03 | Discharge: 2018-12-03 | Disposition: A | Discharge status: Home / Self Care | Payer: Self-pay | Attending: Emergency Medicine | Admitting: Emergency Medicine

## 2018-12-03 ENCOUNTER — Emergency Department (HOSPITAL_BASED_OUTPATIENT_CLINIC_OR_DEPARTMENT_OTHER): Payer: Self-pay

## 2018-12-03 ENCOUNTER — Other Ambulatory Visit: Payer: Self-pay

## 2018-12-03 ENCOUNTER — Encounter (HOSPITAL_BASED_OUTPATIENT_CLINIC_OR_DEPARTMENT_OTHER): Payer: Self-pay | Admitting: Emergency Medicine

## 2018-12-03 DIAGNOSIS — Z87891 Personal history of nicotine dependence: Secondary | ICD-10-CM | POA: Insufficient documentation

## 2018-12-03 DIAGNOSIS — R69 Illness, unspecified: Secondary | ICD-10-CM

## 2018-12-03 DIAGNOSIS — J111 Influenza due to unidentified influenza virus with other respiratory manifestations: Secondary | ICD-10-CM | POA: Insufficient documentation

## 2018-12-03 DIAGNOSIS — Z79899 Other long term (current) drug therapy: Secondary | ICD-10-CM | POA: Insufficient documentation

## 2018-12-03 DIAGNOSIS — J45909 Unspecified asthma, uncomplicated: Secondary | ICD-10-CM | POA: Insufficient documentation

## 2018-12-03 MED ORDER — OSELTAMIVIR PHOSPHATE 75 MG PO CAPS: 75.0000 mg | ORAL_CAPSULE | Freq: Two times a day (BID) | ORAL | 0 refills | Status: DC

## 2018-12-03 MED ORDER — IBUPROFEN 800 MG PO TABS
ORAL_TABLET | ORAL | Status: AC
  Filled 2018-12-03: qty 1

## 2018-12-03 MED ORDER — ACETAMINOPHEN 500 MG PO TABS
1000.0000 mg | ORAL_TABLET | Freq: Once | ORAL | Status: AC
Start: 2018-12-03 — End: 2018-12-03
  Administered 2018-12-03: 1000 mg via ORAL
  Filled 2018-12-03: qty 2

## 2018-12-03 MED ORDER — IBUPROFEN 800 MG PO TABS
800.0000 mg | ORAL_TABLET | Freq: Once | ORAL | Status: AC
  Administered 2018-12-03: 800 mg via ORAL

## 2018-12-03 NOTE — ED Triage Notes (Signed)
Pt c/o 8/10 generalized body ache for the past few days with fever and chills.

## 2018-12-03 NOTE — Discharge Instructions (Signed)
Tamiflu as prescribed.  Tylenol 1000 mg rotated with ibuprofen 600 mg every 4 hours as needed for pain or fever.  Over-the-counter decongestants and cough suppressants as needed for relief of symptoms.  Plenty of fluids and get plenty of rest.

## 2018-12-03 NOTE — ED Provider Notes (Signed)
[facility] EMERGENCY DEPARTMENT Provider Note   CSN: [id number] Arrival date & time: 12/03/18  0033     History   Chief Complaint Chief Complaint  Patient presents with  . Influenza    HPI Donald Fisher is a 27 y.o. male.  Patient is a 27 year old male with history of asthma.  He presents with a 24-hour history of fever, cough, congestion along with body aches.  He denies any ill contacts.  He has had little relief with over-the-counter medications.  The history is provided by the patient.  Influenza  Presenting symptoms: cough, fatigue, fever, headache, myalgias and nausea   Severity:  Moderate Onset quality:  Sudden Duration:  1 day Progression:  Worsening Chronicity:  New Relieved by:  Nothing Worsened by:  Nothing Ineffective treatments:  OTC medications   Past Medical History:  Diagnosis Date  . Asthma    hx of   . Bronchitis   . GERD (gastroesophageal reflux disease)   . Heart murmur    hx of   . Pneumonia    hx of     Patient Active Problem List   Diagnosis Date Noted  . Status post labral repair of shoulder 09/25/2016  . Asthma attack 09/15/2016  . Abdominal pain 09/15/2016    Past Surgical History:  Procedure Laterality Date  . SHOULDER ARTHROSCOPY WITH LABRAL REPAIR Right 07/21/2016   Procedure: RIGHT SHOULDER ARTHROSCOPY WITH LABRAL REPAIR;  Surgeon: Kathryne Hitchhristopher Y Blackman, MD;  Location: [facility];  Service: Orthopedics;  Laterality: Right;  Shoulder Block        Home Medications    Prior to Admission medications   Medication Sig Start Date End Date Taking? Authorizing Provider  albuterol (PROVENTIL HFA;VENTOLIN HFA) 108 (90 Base) MCG/ACT inhaler Inhale 2 puffs into the lungs every 6 (six) hours as needed for wheezing or shortness of breath. 09/18/16   Kathlen ModyAkula, Vijaya, MD  albuterol (PROVENTIL) (2.5 MG/3ML) 0.083% nebulizer solution Take 3 mLs (2.5 mg total) by nebulization every 2 (two) hours as needed for wheezing or shortness of  breath. 09/18/16   Kathlen ModyAkula, Vijaya, MD  chlorhexidine (PERIDEX) 0.12 % solution Use as directed 15 mLs in the mouth or throat 2 (two) times daily. 11/16/17   Riki SheerYoung, Michelle G, PA-C  dicyclomine (BENTYL) 20 MG tablet Take 1 tablet (20 mg total) by mouth 2 (two) times daily as needed for spasms. 09/21/17   Charlynne PanderYao, David Hsienta, MD  ibuprofen (ADVIL,MOTRIN) 800 MG tablet Take 1 tablet (800 mg total) by mouth every 8 (eight) hours as needed. Patient not taking: Reported on 06/22/2017 02/15/17   Ward, Chase PicketJaime Pilcher, PA-C  naproxen (NAPROSYN) 375 MG tablet Take 1 tablet (375 mg total) by mouth 2 (two) times daily with a meal. 12/05/17   Palumbo, April, MD  naproxen (NAPROSYN) 500 MG tablet Take 1 tablet (500 mg total) by mouth 2 (two) times daily with a meal. 11/16/17   Young, Dillard CannonMichelle G, PA-C  oxyCODONE-acetaminophen (PERCOCET/ROXICET) 5-325 MG tablet Take 1 tablet by mouth every 6 (six) hours as needed for severe pain. 10/05/18   Cristina GongHammond, Elizabeth W, PA-C    Family History Family History  Problem Relation Age of Onset  . Hypertension Mother   . Congestive Heart Failure Maternal Grandmother   . Hypertension Maternal Grandmother     Social History Social History   Tobacco Use  . Smoking status: Former Smoker    Packs/day: 0.50    Years: 10.00    Pack years: 5.00  Types: Cigarettes  . Smokeless tobacco: Never Used  Substance Use Topics  . Alcohol use: Yes    Comment: occasionally  . Drug use: Yes    Types: Marijuana     Allergies   Penicillins; Shellfish allergy; and Betadine [povidone iodine]   Review of Systems Review of Systems  Constitutional: Positive for fatigue and fever.  Respiratory: Positive for cough.   Gastrointestinal: Positive for nausea.  Musculoskeletal: Positive for myalgias.  Neurological: Positive for headaches.  All other systems reviewed and are negative.    Physical Exam Updated Vital Signs BP 129/87 (BP Location: Left Arm)   Pulse 88   Temp 99 F  (37.2 C) (Oral)   Resp 16   Ht 5\' 7"  (1.702 m)   Wt 104.3 kg   SpO2 98%   BMI 36.02 kg/m   Physical Exam Vitals signs and nursing note reviewed.  Constitutional:      General: He is not in acute distress.    Appearance: He is well-developed. He is not diaphoretic.  HENT:     Head: Normocephalic and atraumatic.     Right Ear: Tympanic membrane normal.     Left Ear: Tympanic membrane normal.     Mouth/Throat:     Mouth: Mucous membranes are moist.  Neck:     Musculoskeletal: Normal range of motion and neck supple.  Cardiovascular:     Rate and Rhythm: Normal rate and regular rhythm.     Heart sounds: No murmur. No friction rub.  Pulmonary:     Effort: Pulmonary effort is normal. No respiratory distress.     Breath sounds: Normal breath sounds. No wheezing or rales.  Abdominal:     General: Bowel sounds are normal. There is no distension.     Palpations: Abdomen is soft.     Tenderness: There is no abdominal tenderness.  Musculoskeletal: Normal range of motion.  Skin:    General: Skin is warm and dry.  Neurological:     Mental Status: He is alert and oriented to person, place, and time.     Coordination: Coordination normal.      ED Treatments / Results  Labs (all labs ordered are listed, but only abnormal results are displayed) Labs Reviewed - No data to display  EKG None  Radiology Dg Chest 2 View  Result Date: 12/03/2018 CLINICAL DATA:  Shortness of breath EXAM: CHEST - 2 VIEW COMPARISON:  12/04/2017 FINDINGS: The heart size and mediastinal contours are within normal limits. Both lungs are clear. The visualized skeletal structures are unremarkable. IMPRESSION: No active cardiopulmonary disease. Electronically Signed   By: Deatra RobinsonKevin  Herman M.D.   On: 12/03/2018 01:45    Procedures Procedures (including critical care time)  Medications Ordered in ED Medications  acetaminophen (TYLENOL) tablet 1,000 mg (1,000 mg Oral Given 12/03/18 0052)     Initial  Impression / Assessment and Plan / ED Course  I have reviewed the triage vital signs and the nursing notes.  Pertinent labs & imaging results that were available during my care of the patient were reviewed by me and considered in my medical decision making (see chart for details).  Patient presents with upper respiratory symptoms that are most likely viral in nature, possibly influenza.  Symptoms started yesterday.  He will be treated with Tamiflu and continued use of over-the-counter medications.  His physical exam is otherwise unremarkable and vital signs are stable.  Oxygen saturations are 100%.  Final Clinical Impressions(s) / ED Diagnoses   Final diagnoses:  None    ED Discharge Orders    None       Geoffery Lyons, MD 12/03/18 [phone]

## 2018-12-05 ENCOUNTER — Ambulatory Visit (HOSPITAL_COMMUNITY)
Admission: EM | Admit: 2018-12-05 | Discharge: 2018-12-05 | Disposition: A | Discharge status: Home / Self Care | Payer: Self-pay | Attending: Family Medicine | Admitting: Family Medicine

## 2018-12-05 ENCOUNTER — Other Ambulatory Visit: Payer: Self-pay

## 2018-12-05 ENCOUNTER — Encounter (HOSPITAL_COMMUNITY): Payer: Self-pay | Admitting: Emergency Medicine

## 2018-12-05 DIAGNOSIS — R0789 Other chest pain: Secondary | ICD-10-CM | POA: Insufficient documentation

## 2018-12-05 DIAGNOSIS — J111 Influenza due to unidentified influenza virus with other respiratory manifestations: Secondary | ICD-10-CM | POA: Insufficient documentation

## 2018-12-05 MED ORDER — IBUPROFEN 800 MG PO TABS: 800.0000 mg | ORAL_TABLET | Freq: Three times a day (TID) | ORAL | 0 refills | Status: DC

## 2018-12-05 MED ORDER — HYDROCODONE-ACETAMINOPHEN 7.5-325 MG PO TABS: 1.0000 | ORAL_TABLET | Freq: Four times a day (QID) | ORAL | 0 refills | Status: DC | PRN

## 2018-12-05 MED ORDER — KETOROLAC TROMETHAMINE 60 MG/2ML IM SOLN
INTRAMUSCULAR | Status: AC
  Filled 2018-12-05: qty 2

## 2018-12-05 MED ORDER — KETOROLAC TROMETHAMINE 60 MG/2ML IM SOLN
60.0000 mg | Freq: Once | INTRAMUSCULAR | Status: AC
  Administered 2018-12-05: 60 mg via INTRAMUSCULAR

## 2018-12-05 MED ORDER — HYDROCODONE-IBUPROFEN 7.5-200 MG PO TABS: 1.0000 | ORAL_TABLET | Freq: Four times a day (QID) | ORAL | 0 refills | Status: DC | PRN

## 2018-12-05 NOTE — Discharge Instructions (Addendum)
Take the ibuprofen 3 x a day with food If needed take the stronger pain medicine hydrocodone Do not drive on hydrocodone Drink plenty of fluids Continue asthma inhalers Return if worse

## 2018-12-05 NOTE — ED Provider Notes (Signed)
[facility]    CSN: [id number] Arrival date & time: 12/05/18  1522     History   Chief Complaint Chief Complaint  Patient presents with  . Shortness of Breath    HPI Donald Fisher is a 28 y.o. male.   HPI  Patient was seen in the emergency room 2 days ago for respiratory infection.  He is here today because he has acute chest pain.  It is all around the bottom portion of his left ribs.  It hurts with deep breath.  Hurts with cough.  Hurts with movement.  He is taken no medication for the pain.  It hurts worse with laying down.  He feels short of breath.  He has underlying asthma.  He is taking his Tamiflu.  He is using his inhaler.  States that it hurts mostly with coughing and deep breathing.  No sputum production.  Fever and chills are improving.  Is a history of walking pneumonia and is worried about this.  He did have a negative chest x-ray on 12/03/2018  Past Medical History:  Diagnosis Date  . Asthma    hx of   . Bronchitis   . GERD (gastroesophageal reflux disease)   . Heart murmur    hx of   . Pneumonia    hx of     Patient Active Problem List   Diagnosis Date Noted  . Status post labral repair of shoulder 09/25/2016  . Asthma attack 09/15/2016  . Abdominal pain 09/15/2016    Past Surgical History:  Procedure Laterality Date  . SHOULDER ARTHROSCOPY WITH LABRAL REPAIR Right 07/21/2016   Procedure: RIGHT SHOULDER ARTHROSCOPY WITH LABRAL REPAIR;  Surgeon: Kathryne Hitch, MD;  Location: [facility];  Service: Orthopedics;  Laterality: Right;  Shoulder Block       Home Medications    Prior to Admission medications   Medication Sig Start Date End Date Taking? Authorizing Provider  albuterol (PROVENTIL HFA;VENTOLIN HFA) 108 (90 Base) MCG/ACT inhaler Inhale 2 puffs into the lungs every 6 (six) hours as needed for wheezing or shortness of breath. 09/18/16  Yes Kathlen Mody, MD  oseltamivir (TAMIFLU) 75 MG capsule Take 1 capsule (75 mg total) by  mouth every 12 (twelve) hours. 12/03/18  Yes Delo, Riley Lam, MD  albuterol (PROVENTIL) (2.5 MG/3ML) 0.083% nebulizer solution Take 3 mLs (2.5 mg total) by nebulization every 2 (two) hours as needed for wheezing or shortness of breath. 09/18/16   Kathlen Mody, MD  chlorhexidine (PERIDEX) 0.12 % solution Use as directed 15 mLs in the mouth or throat 2 (two) times daily. 11/16/17   Riki Sheer, PA-C  dicyclomine (BENTYL) 20 MG tablet Take 1 tablet (20 mg total) by mouth 2 (two) times daily as needed for spasms. 09/21/17   Charlynne Pander, MD  HYDROcodone-acetaminophen (NORCO) 7.5-325 MG tablet Take 1 tablet by mouth every 6 (six) hours as needed for moderate pain. 12/05/18   Eustace Moore, MD  HYDROcodone-ibuprofen (VICOPROFEN) 7.5-200 MG tablet Take 1 tablet by mouth every 6 (six) hours as needed for moderate pain. 12/05/18   Eustace Moore, MD  ibuprofen (ADVIL,MOTRIN) 800 MG tablet Take 1 tablet (800 mg total) by mouth 3 (three) times daily. 12/05/18   Eustace Moore, MD    Family History Family History  Problem Relation Age of Onset  . Hypertension Mother   . Congestive Heart Failure Maternal Grandmother   . Hypertension Maternal Grandmother     Social History Social History  Tobacco Use  . Smoking status: Former Smoker    Packs/day: 0.50    Years: 10.00    Pack years: 5.00    Types: Cigarettes  . Smokeless tobacco: Never Used  Substance Use Topics  . Alcohol use: Yes    Comment: occasionally  . Drug use: Yes    Types: Marijuana     Allergies   Penicillins; Shellfish allergy; and Betadine [povidone iodine]   Review of Systems Review of Systems  Constitutional: Positive for fatigue. Negative for chills and fever.  HENT: Positive for congestion. Negative for ear pain and sore throat.   Eyes: Negative for pain and visual disturbance.  Respiratory: Positive for cough and chest tightness. Negative for shortness of breath.   Cardiovascular: Positive for chest  pain. Negative for palpitations.  Gastrointestinal: Negative for abdominal pain and vomiting.  Genitourinary: Negative for dysuria and hematuria.  Musculoskeletal: Negative for arthralgias and back pain.  Skin: Negative for color change and rash.  Neurological: Negative for seizures and syncope.  All other systems reviewed and are negative.    Physical Exam Triage Vital Signs ED Triage Vitals  Enc Vitals Group     BP 12/05/18 1601 129/72     Pulse Rate 12/05/18 1601 96     Resp 12/05/18 1601 16     Temp 12/05/18 1601 98.2 F (36.8 C)     Temp Source 12/05/18 1601 Oral     SpO2 12/05/18 1601 98 %     Weight --      Height --      Head Circumference --      Peak Flow --      Pain Score 12/05/18 1559 8     Pain Loc --      Pain Edu? --      Excl. in GC? --    No data found.  Updated Vital Signs BP 129/72   Pulse 96   Temp 98.2 F (36.8 C) (Oral)   Resp 16   SpO2 98%       Physical Exam Constitutional:      General: He is not in acute distress.    Appearance: He is well-developed.  HENT:     Head: Normocephalic and atraumatic.     Mouth/Throat:     Mouth: Mucous membranes are moist.  Eyes:     Conjunctiva/sclera: Conjunctivae normal.     Pupils: Pupils are equal, round, and reactive to light.  Neck:     Musculoskeletal: Normal range of motion.  Cardiovascular:     Rate and Rhythm: Normal rate and regular rhythm.     Heart sounds: Normal heart sounds.  Pulmonary:     Effort: Pulmonary effort is normal. No respiratory distress.     Breath sounds: Normal breath sounds. No decreased breath sounds or wheezing.  Chest:     Comments: Chest wall tenderness at the left lower border of the ribs and at the left sternal border.  Patient has an exaggerated grimacing and pulling away with even light touch over his left anterior chest.  No bruising or crepitus noticed. Abdominal:     General: There is no distension.     Palpations: Abdomen is soft.  Musculoskeletal:  Normal range of motion.  Lymphadenopathy:     Cervical: No cervical adenopathy.  Skin:    General: Skin is warm and dry.  Neurological:     General: No focal deficit present.     Mental Status: He is alert. He is disoriented.  UC Treatments / Results  Labs (all labs ordered are listed, but only abnormal results are displayed) Labs Reviewed - No data to display  EKG None  Radiology No results found.  Procedures Procedures (including critical care time)  Medications Ordered in UC Medications  ketorolac (TORADOL) injection 60 mg (60 mg Intramuscular Given 12/05/18 1723)    Initial Impression / Assessment and Plan / UC Course  I have reviewed the triage vital signs and the nursing notes.  Pertinent labs & imaging results that were available during my care of the patient were reviewed by me and considered in my medical decision making (see chart for details).     Discussed chest wall pain.  He was worried but pneumonia.  I told that you can touch pneumonia from the outside of the chest wall and reproduce the pain.  We will give him anti-inflammatory medications, rest, and expect improvement over few days.  He knows to return if worse Final Clinical Impressions(s) / UC Diagnoses   Final diagnoses:  Acute chest wall pain  Influenza with respiratory manifestation     Discharge Instructions     Take the ibuprofen 3 x a day with food If needed take the stronger pain medicine hydrocodone Do not drive on hydrocodone Drink plenty of fluids Continue asthma inhalers Return if worse    ED Prescriptions    Medication Sig Dispense Auth. Provider   ibuprofen (ADVIL,MOTRIN) 800 MG tablet Take 1 tablet (800 mg total) by mouth 3 (three) times daily. 21 tablet Eustace MooreNelson, Katyana Trolinger Sue, MD   HYDROcodone-acetaminophen [facility](NORCO) 7.5-325 MG tablet Take 1 tablet by mouth every 6 (six) hours as needed for moderate pain. 10 tablet Eustace MooreNelson, Casey Maxfield Sue, MD     Controlled Substance  Prescriptions [place] Controlled Substance Registry consulted? Yes, I have consulted the [place] Controlled Substances Registry for this patient, and feel the risk/benefit ratio today is favorable for proceeding with this prescription for a controlled substance.   Eustace MooreNelson, Rowyn Spilde Sue, MD 12/06/18 1336

## 2018-12-05 NOTE — ED Triage Notes (Signed)
PT reports he had walking pneumonia in November. PT reports back pain that is worse when laying down. PT reports SOB. Pain is worse when taking a deep. PT reports chest pain when talking a deep breath.

## 2020-07-28 ENCOUNTER — Encounter (HOSPITAL_COMMUNITY): Payer: Self-pay

## 2020-07-28 ENCOUNTER — Other Ambulatory Visit: Payer: Self-pay

## 2020-07-28 ENCOUNTER — Emergency Department (HOSPITAL_COMMUNITY)
Admission: EM | Admit: 2020-07-28 | Discharge: 2020-07-28 | Disposition: A | Discharge status: Home / Self Care | Payer: Self-pay | Attending: Emergency Medicine | Admitting: Emergency Medicine

## 2020-07-28 DIAGNOSIS — Z79899 Other long term (current) drug therapy: Secondary | ICD-10-CM | POA: Insufficient documentation

## 2020-07-28 DIAGNOSIS — W57XXXA Bitten or stung by nonvenomous insect and other nonvenomous arthropods, initial encounter: Secondary | ICD-10-CM | POA: Insufficient documentation

## 2020-07-28 DIAGNOSIS — Z91030 Bee allergy status: Secondary | ICD-10-CM | POA: Insufficient documentation

## 2020-07-28 DIAGNOSIS — R0602 Shortness of breath: Secondary | ICD-10-CM | POA: Insufficient documentation

## 2020-07-28 DIAGNOSIS — T782XXA Anaphylactic shock, unspecified, initial encounter: Secondary | ICD-10-CM | POA: Insufficient documentation

## 2020-07-28 DIAGNOSIS — R000 Tachycardia, unspecified: Secondary | ICD-10-CM | POA: Insufficient documentation

## 2020-07-28 DIAGNOSIS — Z87891 Personal history of nicotine dependence: Secondary | ICD-10-CM | POA: Insufficient documentation

## 2020-07-28 DIAGNOSIS — J45909 Unspecified asthma, uncomplicated: Secondary | ICD-10-CM | POA: Insufficient documentation

## 2020-07-28 DIAGNOSIS — Y939 Activity, unspecified: Secondary | ICD-10-CM | POA: Insufficient documentation

## 2020-07-28 DIAGNOSIS — Y929 Unspecified place or not applicable: Secondary | ICD-10-CM | POA: Insufficient documentation

## 2020-07-28 DIAGNOSIS — Y999 Unspecified external cause status: Secondary | ICD-10-CM | POA: Insufficient documentation

## 2020-07-28 DIAGNOSIS — R21 Rash and other nonspecific skin eruption: Secondary | ICD-10-CM | POA: Insufficient documentation

## 2020-07-28 DIAGNOSIS — J029 Acute pharyngitis, unspecified: Secondary | ICD-10-CM | POA: Insufficient documentation

## 2020-07-28 DIAGNOSIS — T7840XA Allergy, unspecified, initial encounter: Secondary | ICD-10-CM

## 2020-07-28 MED ORDER — METHYLPREDNISOLONE SODIUM SUCC 125 MG IJ SOLR
125.0000 mg | Freq: Once | INTRAMUSCULAR | Status: AC
Start: 2020-07-28 — End: 2020-07-28
  Administered 2020-07-28: 125 mg via INTRAVENOUS
  Filled 2020-07-28: qty 2

## 2020-07-28 MED ORDER — DIPHENHYDRAMINE HCL 50 MG/ML IJ SOLN
25.0000 mg | Freq: Once | INTRAMUSCULAR | Status: AC
Start: 2020-07-28 — End: 2020-07-28
  Administered 2020-07-28: 25 mg via INTRAVENOUS
  Filled 2020-07-28: qty 1

## 2020-07-28 MED ORDER — SODIUM CHLORIDE 0.9 % IV SOLN: INTRAVENOUS | Status: DC

## 2020-07-28 MED ORDER — ALBUTEROL SULFATE HFA 108 (90 BASE) MCG/ACT IN AERS
2.0000 | INHALATION_SPRAY | Freq: Once | RESPIRATORY_TRACT | Status: AC
  Administered 2020-07-28: 2 via RESPIRATORY_TRACT
  Filled 2020-07-28: qty 6.7

## 2020-07-28 MED ORDER — ACETAMINOPHEN 500 MG PO TABS
500.0000 mg | ORAL_TABLET | Freq: Once | ORAL | Status: AC
  Administered 2020-07-28: 500 mg via ORAL
  Filled 2020-07-28: qty 1

## 2020-07-28 MED ORDER — EPINEPHRINE 0.3 MG/0.3ML IJ SOAJ
0.3000 mg | Freq: Once | INTRAMUSCULAR | Status: AC
Start: 2020-07-28 — End: 2020-07-28
  Administered 2020-07-28: 0.3 mg via INTRAMUSCULAR
  Filled 2020-07-28: qty 0.3

## 2020-07-28 MED ORDER — EPINEPHRINE 0.3 MG/0.3ML IJ SOAJ: 0.3000 mg | INTRAMUSCULAR | 0 refills | Status: DC | PRN

## 2020-07-28 MED ORDER — FAMOTIDINE IN NACL 20-0.9 MG/50ML-% IV SOLN
20.0000 mg | Freq: Once | INTRAVENOUS | Status: AC
Start: 2020-07-28 — End: 2020-07-28
  Administered 2020-07-28: 20 mg via INTRAVENOUS
  Filled 2020-07-28: qty 50

## 2020-07-28 MED ORDER — SODIUM CHLORIDE 0.9 % IV BOLUS
1000.0000 mL | Freq: Once | INTRAVENOUS | Status: AC
Start: 2020-07-28 — End: 2020-07-28
  Administered 2020-07-28: 1000 mL via INTRAVENOUS

## 2020-07-28 MED ORDER — PREDNISONE 20 MG PO TABS: 40.0000 mg | ORAL_TABLET | Freq: Every day | ORAL | 0 refills | Status: AC

## 2020-07-28 NOTE — ED Triage Notes (Signed)
Pt states he got stung by a bee on his neck.pt covered in hives all over his body and skin is red. Pt is talking and alert at this time.

## 2020-07-28 NOTE — ED Provider Notes (Signed)
[facility] EMERGENCY DEPARTMENT Provider Note   CSN: [id number] Arrival date & time: 07/28/20  1751     History Chief Complaint  Patient presents with  . Allergic Reaction    Donald Fisher is a 29 y.o. male.  Now presents ER with concern for anaphylaxis.  Patient reports that he was stung by bee on the right side of his neck, then developed hives all over his body, started having difficult time breathing, felt like his throat was closing in on him, had a hard time speaking.  No nausea, vomiting, abdominal pain, chest pain.  Denies prior history of anaphylaxis, does have prior history of asthma.  HPI     Past Medical History:  Diagnosis Date  . Asthma    hx of   . Bronchitis   . GERD (gastroesophageal reflux disease)   . Heart murmur    hx of   . Pneumonia    hx of     Patient Active Problem List   Diagnosis Date Noted  . Status post labral repair of shoulder 09/25/2016  . Asthma attack 09/15/2016  . Abdominal pain 09/15/2016    Past Surgical History:  Procedure Laterality Date  . SHOULDER ARTHROSCOPY WITH LABRAL REPAIR Right 07/21/2016   Procedure: RIGHT SHOULDER ARTHROSCOPY WITH LABRAL REPAIR;  Surgeon: Kathryne Hitch, MD;  Location: [facility];  Service: Orthopedics;  Laterality: Right;  Shoulder Block       Family History  Problem Relation Age of Onset  . Hypertension Mother   . Congestive Heart Failure Maternal Grandmother   . Hypertension Maternal Grandmother     Social History   Tobacco Use  . Smoking status: Former Smoker    Packs/day: 0.50    Years: 10.00    Pack years: 5.00    Types: Cigarettes  . Smokeless tobacco: Never Used  Vaping Use  . Vaping Use: Never used  Substance Use Topics  . Alcohol use: Yes    Comment: occasionally  . Drug use: Yes    Types: Marijuana    Home Medications Prior to Admission medications   Medication Sig Start Date End Date Taking? Authorizing Provider  albuterol (PROVENTIL  HFA;VENTOLIN HFA) 108 (90 Base) MCG/ACT inhaler Inhale 2 puffs into the lungs every 6 (six) hours as needed for wheezing or shortness of breath. 09/18/16  Yes Kathlen Mody, MD  albuterol (PROVENTIL) (2.5 MG/3ML) 0.083% nebulizer solution Take 3 mLs (2.5 mg total) by nebulization every 2 (two) hours as needed for wheezing or shortness of breath. 09/18/16  Yes Kathlen Mody, MD  DM-Doxylamine-Acetaminophen (VICKS NYQUIL COLD & FLU) 15-6.25-325 MG CAPS Take 1-2 capsules by mouth every 6 (six) hours as needed (for cold/flu-like symptoms).   Yes [provider]  chlorhexidine (PERIDEX) 0.12 % solution Use as directed 15 mLs in the mouth or throat 2 (two) times daily. Patient not taking: Reported on 07/28/2020 11/16/17   Riki Sheer, PA-C  dicyclomine (BENTYL) 20 MG tablet Take 1 tablet (20 mg total) by mouth 2 (two) times daily as needed for spasms. Patient not taking: Reported on 07/28/2020 09/21/17   Charlynne Pander, MD  EPINEPHrine 0.3 mg/0.3 mL IJ SOAJ injection Inject 0.3 mLs (0.3 mg total) into the muscle as needed for anaphylaxis. 07/28/20   Milagros Loll, MD  HYDROcodone-acetaminophen (NORCO) 7.5-325 MG tablet Take 1 tablet by mouth every 6 (six) hours as needed for moderate pain. Patient not taking: Reported on 07/28/2020 12/05/18   Eustace Moore, MD  HYDROcodone-ibuprofen (VICOPROFEN) 7.5-200 MG tablet Take 1 tablet by mouth every 6 (six) hours as needed for moderate pain. Patient not taking: Reported on 07/28/2020 12/05/18   Eustace Moore, MD  ibuprofen (ADVIL,MOTRIN) 800 MG tablet Take 1 tablet (800 mg total) by mouth 3 (three) times daily. Patient not taking: Reported on 07/28/2020 12/05/18   Eustace Moore, MD  oseltamivir (TAMIFLU) 75 MG capsule Take 1 capsule (75 mg total) by mouth every 12 (twelve) hours. Patient not taking: Reported on 07/28/2020 12/03/18   Geoffery Lyons, MD  predniSONE (DELTASONE) 20 MG tablet Take 2 tablets (40 mg total) by mouth daily for 4  days. 07/28/20 08/01/20  Milagros Loll, MD    Allergies    Bee venom, Shellfish allergy, Penicillins, and Betadine [povidone iodine]  Review of Systems   Review of Systems  Constitutional: Negative for chills and fever.  HENT: Positive for sore throat. Negative for ear pain.   Eyes: Negative for pain and visual disturbance.  Respiratory: Positive for shortness of breath. Negative for cough.   Cardiovascular: Negative for chest pain and palpitations.  Gastrointestinal: Negative for abdominal pain and vomiting.  Genitourinary: Negative for dysuria and hematuria.  Musculoskeletal: Negative for arthralgias and back pain.  Skin: Positive for rash. Negative for color change.  Neurological: Negative for seizures and syncope.  All other systems reviewed and are negative.   Physical Exam Updated Vital Signs BP 123/73   Pulse 90   Temp 98.8 F (37.1 C) (Oral)   Resp 19   SpO2 95%   Physical Exam Vitals and nursing note reviewed.  Constitutional:      Appearance: He is well-developed.  HENT:     Head: Normocephalic and atraumatic.     Mouth/Throat:     Comments: Clear posterior oropharynx, no swelling Eyes:     Conjunctiva/sclera: Conjunctivae normal.  Cardiovascular:     Rate and Rhythm: Regular rhythm. Tachycardia present.     Heart sounds: No murmur heard.   Pulmonary:     Comments: Some tachypnea, b/l expiratory wheeze present Abdominal:     Palpations: Abdomen is soft.     Tenderness: There is no abdominal tenderness.  Musculoskeletal:        General: No deformity or signs of injury.     Cervical back: Normal range of motion and neck supple. No rigidity.  Skin:    General: Skin is warm and dry.     Capillary Refill: Capillary refill takes less than 2 seconds.  Neurological:     General: No focal deficit present.     Mental Status: He is alert.  Psychiatric:        Mood and Affect: Mood normal.     ED Results / Procedures / Treatments   Labs (all labs  ordered are listed, but only abnormal results are displayed) Labs Reviewed - No data to display  EKG None  Radiology No results found.  Procedures .Critical Care Performed by: Milagros Loll, MD Authorized by: Milagros Loll, MD   Critical care provider statement:    Critical care time (minutes):  34   Critical care was time spent personally by me on the following activities:  Discussions with consultants, evaluation of patient's response to treatment, examination of patient, ordering and performing treatments and interventions, ordering and review of laboratory studies, ordering and review of radiographic studies, pulse oximetry, re-evaluation of patient's condition, obtaining history from patient or surrogate and review of old charts   (including critical care  time)  Medications Ordered in ED Medications  sodium chloride 0.9 % bolus 1,000 mL (0 mLs Intravenous Stopped 07/28/20 1938)    And  0.9 %  sodium chloride infusion ( Intravenous Stopped 07/28/20 2159)  diphenhydrAMINE (BENADRYL) injection 25 mg (25 mg Intravenous Given 07/28/20 1805)  EPINEPHrine (EPI-PEN) injection 0.3 mg (0.3 mg Intramuscular Given 07/28/20 1803)  methylPREDNISolone sodium succinate (SOLU-MEDROL) 125 mg/2 mL injection 125 mg (125 mg Intravenous Given 07/28/20 1804)  famotidine (PEPCID) IVPB 20 mg premix (0 mg Intravenous Stopped 07/28/20 1845)  acetaminophen (TYLENOL) tablet 500 mg (500 mg Oral Given 07/28/20 1902)  albuterol (VENTOLIN HFA) 108 (90 Base) MCG/ACT inhaler 2 puff (2 puffs Inhalation Given 07/28/20 1902)    ED Course  I have reviewed the triage vital signs and the nursing notes.  Pertinent labs & imaging results that were available during my care of the patient were reviewed by me and considered in my medical decision making (see chart for details).    MDM Rules/Calculators/A&P                         29 year old male presents to ER with concern for anaphylaxis.  Reports stung by bee  on his neck, and developed hives then developed difficulty breathing.  On initial assessment, concern for anaphylaxis.  Received IM epi x2, Solu-Medrol, Benadryl, Pepcid, albuterol, subsequently had complete resolution of his symptoms.  He was observed in the ER for around 4 hours.  On subsequent reassessments, patient remained well-appearing and asymptomatic.  Given his current clinical picture, believe he can be discharged and managed in the outpatient setting.  Provided Rx for EpiPen and course of steroids.  Reviewed indications for epi, return precautions.  Girlfriend at bedside also present for education.  Discharged home.    After the discussed management above, the patient was determined to be safe for discharge.  The patient was in agreement with this plan and all questions regarding their care were answered.  ED return precautions were discussed and the patient will return to the ED with any significant worsening of condition.    Final Clinical Impression(s) / ED Diagnoses Final diagnoses:  Allergic reaction, initial encounter  Anaphylaxis, initial encounter    Rx / DC Orders ED Discharge Orders         Ordered    predniSONE (DELTASONE) 20 MG tablet  Daily        07/28/20 2137    EPINEPHrine 0.3 mg/0.3 mL IJ SOAJ injection  As needed,   Status:  Discontinued        07/28/20 2137    EPINEPHrine 0.3 mg/0.3 mL IJ SOAJ injection  As needed        07/28/20 2141           Milagros Loll, MD 07/29/20 0009

## 2020-07-28 NOTE — Discharge Instructions (Addendum)
Take steroids as as prescribed.  Take Benadryl as needed for mild symptoms such as itchiness.  If you develop severe symptoms like shortness of breath, throat tightness, airway swelling or other, recommend return to ER for reassessment.  See attached document with indications for epi pen. If you ever use the epi pen, you should come to ER to be seen.

## 2020-07-28 NOTE — ED Notes (Signed)
Pt to ED, unable to speak in full sentences, hives, wheezing, stating he can't breathe. Epi 0.3mg  IM given right buttocks, administered override per protocol. Pt wheeled to exam room.

## 2020-09-28 ENCOUNTER — Emergency Department (HOSPITAL_BASED_OUTPATIENT_CLINIC_OR_DEPARTMENT_OTHER): Payer: Self-pay

## 2020-09-28 ENCOUNTER — Encounter (HOSPITAL_BASED_OUTPATIENT_CLINIC_OR_DEPARTMENT_OTHER): Payer: Self-pay | Admitting: Emergency Medicine

## 2020-09-28 ENCOUNTER — Emergency Department (HOSPITAL_BASED_OUTPATIENT_CLINIC_OR_DEPARTMENT_OTHER)
Admission: EM | Admit: 2020-09-28 | Discharge: 2020-09-28 | Disposition: A | Discharge status: Home / Self Care | Payer: Self-pay | Attending: Emergency Medicine | Admitting: Emergency Medicine

## 2020-09-28 ENCOUNTER — Other Ambulatory Visit: Payer: Self-pay

## 2020-09-28 DIAGNOSIS — J45909 Unspecified asthma, uncomplicated: Secondary | ICD-10-CM | POA: Insufficient documentation

## 2020-09-28 DIAGNOSIS — M62830 Muscle spasm of back: Secondary | ICD-10-CM | POA: Insufficient documentation

## 2020-09-28 DIAGNOSIS — Z87891 Personal history of nicotine dependence: Secondary | ICD-10-CM | POA: Insufficient documentation

## 2020-09-28 MED ORDER — KETOROLAC TROMETHAMINE 30 MG/ML IJ SOLN
60.0000 mg | Freq: Once | INTRAMUSCULAR | Status: AC
  Administered 2020-09-28: 60 mg via INTRAMUSCULAR
  Filled 2020-09-28: qty 2

## 2020-09-28 MED ORDER — METHOCARBAMOL 500 MG PO TABS: 500.0000 mg | ORAL_TABLET | Freq: Two times a day (BID) | ORAL | 0 refills | Status: DC

## 2020-09-28 NOTE — ED Triage Notes (Signed)
Pt is c/o pain in his back under his left shoulder  Pt states pain is worse with movement and when he takes a deep breath  Denies injury

## 2020-09-28 NOTE — ED Provider Notes (Signed)
[facility] EMERGENCY DEPARTMENT Provider Note   CSN: [id number] Arrival date & time: 09/28/20  0510     History Chief Complaint  Patient presents with  . Back Pain    Donald Fisher is a 29 y.o. male.   Back Pain Pain location: left scapular region. Quality:  Cramping (spasm) Radiates to: right parathoracic region. Pain severity:  Moderate Pain is:  Same all the time Onset quality:  Sudden Duration: several hours. Timing:  Constant Progression:  Worsening Chronicity:  New Relieved by:  Nothing Worsened by:  Deep breathing and movement Associated symptoms: no abdominal pain, no chest pain, no fever and no weakness   Patient with history of asthma presents with back pain.  Patient reports when he fell asleep he felt well, but he woke up he started having pain and spasms in his back.  It starts around his left scapula and moves into his right back.  No recent falls or trauma.  He does do heavy lifting at work.  No new arm or leg weakness.  No chest pain or shortness of breath.  No fevers.  He reports the pain is worse with deep breathing and movement     Past Medical History:  Diagnosis Date  . Asthma    hx of   . Bronchitis   . GERD (gastroesophageal reflux disease)   . Heart murmur    hx of   . Pneumonia    hx of     Patient Active Problem List   Diagnosis Date Noted  . Status post labral repair of shoulder 09/25/2016  . Asthma attack 09/15/2016  . Abdominal pain 09/15/2016    Past Surgical History:  Procedure Laterality Date  . SHOULDER ARTHROSCOPY WITH LABRAL REPAIR Right 07/21/2016   Procedure: RIGHT SHOULDER ARTHROSCOPY WITH LABRAL REPAIR;  Surgeon: Kathryne Hitch, MD;  Location: [facility];  Service: Orthopedics;  Laterality: Right;  Shoulder Block       Family History  Problem Relation Age of Onset  . Hypertension Mother   . Congestive Heart Failure Maternal Grandmother   . Hypertension Maternal Grandmother     Social History    Tobacco Use  . Smoking status: Former Smoker    Packs/day: 0.50    Years: 10.00    Pack years: 5.00    Types: Cigarettes  . Smokeless tobacco: Never Used  Vaping Use  . Vaping Use: Never used  Substance Use Topics  . Alcohol use: Yes    Comment: occasionally  . Drug use: Yes    Types: Marijuana    Home Medications Prior to Admission medications   Medication Sig Start Date End Date Taking? Authorizing Provider  albuterol (PROVENTIL HFA;VENTOLIN HFA) 108 (90 Base) MCG/ACT inhaler Inhale 2 puffs into the lungs every 6 (six) hours as needed for wheezing or shortness of breath. 09/18/16   Kathlen Mody, MD  EPINEPHrine 0.3 mg/0.3 mL IJ SOAJ injection Inject 0.3 mLs (0.3 mg total) into the muscle as needed for anaphylaxis. 07/28/20   Milagros Loll, MD  dicyclomine (BENTYL) 20 MG tablet Take 1 tablet (20 mg total) by mouth 2 (two) times daily as needed for spasms. Patient not taking: Reported on 07/28/2020 09/21/17 09/28/20  Charlynne Pander, MD    Allergies    Bee venom, Shellfish allergy, Penicillins, and Betadine [povidone iodine]  Review of Systems   Review of Systems  Constitutional: Negative for fever.  Respiratory: Negative for shortness of breath.   Cardiovascular: Negative for chest  pain.  Gastrointestinal: Negative for abdominal pain.  Musculoskeletal: Positive for back pain.  Neurological: Negative for weakness.  All other systems reviewed and are negative.   Physical Exam Updated Vital Signs BP (!) 132/91 (BP Location: Right Arm)   Pulse 66   Temp 97.6 F (36.4 C) (Oral)   Resp 15   Ht 1.702 m (5\' 7" )   Wt 111.1 kg   SpO2 97%   BMI 38.37 kg/m   Physical Exam CONSTITUTIONAL: Well developed/well nourished HEAD: Normocephalic/atraumatic EYES: EOMI/PERRL ENMT: Mucous membranes moist NECK: supple no meningeal signs SPINE/BACK:entire spine nontender  Tenderness to palpation of the left parathoracic region.  No erythema or rash noted.  There is  tenderness to palpation of the right parathoracic region without erythema or rash. No bruising/crepitance/stepoffs noted to spine CV: S1/S2 noted, no murmurs/rubs/gallops noted LUNGS: Lungs are clear to auscultation bilaterally, no apparent distress ABDOMEN: soft, nontender, no rebound or guarding GU:no cva tenderness NEURO: Awake/alert,equal motor 5/5 strength noted with the following: hip flexion/knee flexion/extension, foot dorsi/plantar flexion, Pt is able to ambulate unassisted. EXTREMITIES: pulses normal, full ROM SKIN: warm, color normal PSYCH: Mildly anxious   ED Results / Procedures / Treatments   Labs (all labs ordered are listed, but only abnormal results are displayed) Labs Reviewed - No data to display  EKG None  Radiology DG Chest 2 View  Result Date: 09/28/2020 CLINICAL DATA:  Back pain to the left shoulder. Worse with a deep breath. EXAM: CHEST - 2 VIEW COMPARISON:  Two-view chest x-ray 12/03/2018. FINDINGS: The heart size and mediastinal contours are within normal limits. Both lungs are clear. The visualized skeletal structures are unremarkable. IMPRESSION: Negative two view chest x-ray Electronically Signed   By: 12/05/2018 M.D.   On: 09/28/2020 06:09    Procedures Procedures    Medications Ordered in ED Medications  ketorolac (TORADOL) 30 MG/ML injection 60 mg (60 mg Intramuscular Given 09/28/20 0539)    ED Course  I have reviewed the triage vital signs and the nursing notes.  Pertinent  imaging results that were available during my care of the patient were reviewed by me and considered in my medical decision making (see chart for details).    MDM Rules/Calculators/A&P                          5:53 AM Patient presents for what appears to be muscle spasms.  Per records, he has had this previously.  He has tenderness with movement and palpation.  No signs of trauma.  He is able to ambulate without any obvious neuro deficits.  He does have some  pain with breathing, but there is no hypoxia or tachycardia.  He denies any chest pain, low suspicion for PE. Will obtain chest x-ray and reassess. 6:19 AM Patient improved.  No acute distress noted. Advised heating pad, muscle relaxants, treat at home. Patient agreeable with plan.  We discussed return precautions Final Clinical Impression(s) / ED Diagnoses Final diagnoses:  Muscle spasm of back    Rx / DC Orders ED Discharge Orders         Ordered    methocarbamol (ROBAXIN) 500 MG tablet  2 times daily        09/28/20 0617           09/30/20, MD 09/28/20 [phone]

## 2020-10-07 ENCOUNTER — Emergency Department (HOSPITAL_COMMUNITY): Payer: Managed Care, Other (non HMO)

## 2020-10-07 ENCOUNTER — Other Ambulatory Visit: Payer: Self-pay

## 2020-10-07 ENCOUNTER — Emergency Department (HOSPITAL_COMMUNITY)
Admission: EM | Admit: 2020-10-07 | Discharge: 2020-10-07 | Disposition: A | Discharge status: Home / Self Care | Payer: Managed Care, Other (non HMO) | Attending: Emergency Medicine | Admitting: Emergency Medicine

## 2020-10-07 DIAGNOSIS — J45909 Unspecified asthma, uncomplicated: Secondary | ICD-10-CM | POA: Diagnosis not present

## 2020-10-07 DIAGNOSIS — Z87891 Personal history of nicotine dependence: Secondary | ICD-10-CM | POA: Diagnosis not present

## 2020-10-07 DIAGNOSIS — Z880 Allergy status to penicillin: Secondary | ICD-10-CM | POA: Diagnosis not present

## 2020-10-07 DIAGNOSIS — M25561 Pain in right knee: Secondary | ICD-10-CM | POA: Diagnosis present

## 2020-10-07 MED ORDER — IBUPROFEN 600 MG PO TABS: 600.0000 mg | ORAL_TABLET | Freq: Four times a day (QID) | ORAL | 0 refills | Status: DC | PRN

## 2020-10-07 MED ORDER — IBUPROFEN 800 MG PO TABS
800.0000 mg | ORAL_TABLET | Freq: Once | ORAL | Status: AC
  Administered 2020-10-07: 800 mg via ORAL
  Filled 2020-10-07: qty 1

## 2020-10-07 MED ORDER — METHOCARBAMOL 500 MG PO TABS: 500.0000 mg | ORAL_TABLET | Freq: Three times a day (TID) | ORAL | 0 refills | Status: DC | PRN

## 2020-10-07 NOTE — ED Provider Notes (Signed)
[facility] EMERGENCY DEPARTMENT Provider Note   CSN: [id number] Arrival date & time: 10/07/20  0938     History Chief Complaint  Patient presents with  . Knee Pain    Kristen L Clements is a 29 y.o. male.  The history is provided by the patient. No language interpreter was used.  Knee Pain Associated symptoms: no fever      29 year old obese male presenting for evaluation of knee pain.  Patient report he developed pain to his knee last night while sitting.  Pain is sharp, shooting, with tingling sensation to his feet and some discomfort to his right buttock.  This morning when getting out of bed he felt that his knee was going to give out thus prompting this ER visit.  Pain improves when he applies pressure to the knee and worse with movement.  Pain is moderate in severity.  No specific treatment tried.  Initially denies any injury but later on states he thinks he may have fallen on it.  Did not give any specific time or how it happened.  He denies any significant back pain no fever chills no calf pain no leg swelling no prior history of DVT.  Past Medical History:  Diagnosis Date  . Asthma    hx of   . Bronchitis   . GERD (gastroesophageal reflux disease)   . Heart murmur    hx of   . Pneumonia    hx of     Patient Active Problem List   Diagnosis Date Noted  . Status post labral repair of shoulder 09/25/2016  . Asthma attack 09/15/2016  . Abdominal pain 09/15/2016    Past Surgical History:  Procedure Laterality Date  . SHOULDER ARTHROSCOPY WITH LABRAL REPAIR Right 07/21/2016   Procedure: RIGHT SHOULDER ARTHROSCOPY WITH LABRAL REPAIR;  Surgeon: Kathryne Hitch, MD;  Location: [facility];  Service: Orthopedics;  Laterality: Right;  Shoulder Block       Family History  Problem Relation Age of Onset  . Hypertension Mother   . Congestive Heart Failure Maternal Grandmother   . Hypertension Maternal Grandmother     Social History   Tobacco Use  .  Smoking status: Former Smoker    Packs/day: 0.50    Years: 10.00    Pack years: 5.00    Types: Cigarettes  . Smokeless tobacco: Never Used  Vaping Use  . Vaping Use: Never used  Substance Use Topics  . Alcohol use: Yes    Comment: occasionally  . Drug use: Yes    Types: Marijuana    Home Medications Prior to Admission medications   Medication Sig Start Date End Date Taking? Authorizing Provider  albuterol (PROVENTIL HFA;VENTOLIN HFA) 108 (90 Base) MCG/ACT inhaler Inhale 2 puffs into the lungs every 6 (six) hours as needed for wheezing or shortness of breath. 09/18/16   Kathlen Mody, MD  EPINEPHrine 0.3 mg/0.3 mL IJ SOAJ injection Inject 0.3 mLs (0.3 mg total) into the muscle as needed for anaphylaxis. 07/28/20   Milagros Loll, MD  methocarbamol (ROBAXIN) 500 MG tablet Take 1 tablet (500 mg total) by mouth 2 (two) times daily. 09/28/20   Zadie Rhine, MD  dicyclomine (BENTYL) 20 MG tablet Take 1 tablet (20 mg total) by mouth 2 (two) times daily as needed for spasms. Patient not taking: Reported on 07/28/2020 09/21/17 09/28/20  Charlynne Pander, MD    Allergies    Bee venom, Shellfish allergy, Penicillins, and Betadine [povidone iodine]  Review of  Systems   Review of Systems  Constitutional: Negative for fever.  Musculoskeletal: Positive for arthralgias.  Skin: Negative for wound.  Neurological: Negative for numbness.    Physical Exam Updated Vital Signs BP 132/87   Pulse 78   Resp 18   Ht 5\' 7"  (1.702 m)   Wt 113.4 kg   SpO2 98%   BMI 39.16 kg/m   Physical Exam Vitals and nursing note reviewed.  Constitutional:      General: He is not in acute distress.    Appearance: He is well-developed.  HENT:     Head: Atraumatic.  Eyes:     Conjunctiva/sclera: Conjunctivae normal.  Abdominal:     Palpations: Abdomen is soft.  Musculoskeletal:        General: Tenderness (Right knee: Patella is located.  Diffuse tenderness throughout knee without any obvious  deformity bruising or overlying skin changes.  Able to flex and extend the knee.  No tenderness to the back of the calf.  DP pulse palpable.) present.     Cervical back: Neck supple.     Comments: And tenderness to right lumbosacral and anterior thigh with gentle palpation but no deformity or overlying skin changes.  Sensation intact.  Skin:    Findings: No rash.  Neurological:     Mental Status: He is alert.     ED Results / Procedures / Treatments   Labs (all labs ordered are listed, but only abnormal results are displayed) Labs Reviewed - No data to display  EKG None  Radiology DG Knee Complete 4 Views Right  Result Date: 10/07/2020 CLINICAL DATA:  Knee pain. EXAM: RIGHT KNEE - COMPLETE 4+ VIEW COMPARISON:  None. FINDINGS: No evidence of fracture, dislocation, or joint effusion. No evidence of arthropathy or other focal bone abnormality. Soft tissues are unremarkable. IMPRESSION: Negative. Electronically Signed   By: 13/03/2020 MD   On: 10/07/2020 08:00    Procedures Procedures (including critical care time)  Medications Ordered in ED Medications  ibuprofen (ADVIL) tablet 800 mg (has no administration in time range)    ED Course  I have reviewed the triage vital signs and the nursing notes.  Pertinent labs & imaging results that were available during my care of the patient were reviewed by me and considered in my medical decision making (see chart for details).    MDM Rules/Calculators/A&P                          BP 132/87   Pulse 78   Resp 18   Ht 5\' 7"  (1.702 m)   Wt 113.4 kg   SpO2 98%   BMI 39.16 kg/m   Final Clinical Impression(s) / ED Diagnoses Final diagnoses:  Acute pain of right knee    Rx / DC Orders ED Discharge Orders    None     8:41 AM Patient initially presents reporting atraumatic right knee pain since yesterday with some discomfort to right lumbosacral and pain sensation to the foot.  No history of diabetes and no recent trauma.   An x-ray of the knee obtained unremarkable.  Patient now mention he thinks he may have landed on his knee from a fall recently causing his pain.  He also report having difficulty bearing weight however was noted to be able to ambulate initially.  Will provide knee sleeves and crutches for support.  Rice therapy discussed.  Discharge home with NSAIDs and muscle relaxant along with orthopedic referral.  No calf tenderness or leg swelling to suggest DVT.  No signs of infection on exam.  No significant lower back or signs of cauda equina or sciatica.   Fayrene Helper, PA-C 10/07/20 0845    Tegeler, Canary Brim, MD 10/07/20 [phone]

## 2020-10-07 NOTE — Discharge Instructions (Signed)
You have been evaluated for your knee pain.  Fortunately x-ray did not show any concerning finding.  You may wear knee sleeve and use crutches as needed for support.  You may try alternate between ice and heat for comfort.  Take ibuprofen and a muscle relaxant as needed.  Follow-up with orthopedist if you notice no improvement.`

## 2020-10-07 NOTE — ED Triage Notes (Signed)
Pt reports R knee pain since yesterday, worse this morning when he got up. Denies injury.

## 2020-11-17 ENCOUNTER — Other Ambulatory Visit: Payer: Self-pay

## 2020-11-18 ENCOUNTER — Other Ambulatory Visit: Payer: Self-pay

## 2020-11-18 ENCOUNTER — Encounter (HOSPITAL_BASED_OUTPATIENT_CLINIC_OR_DEPARTMENT_OTHER): Payer: Self-pay | Admitting: *Deleted

## 2020-11-18 ENCOUNTER — Emergency Department (HOSPITAL_COMMUNITY)
Admission: EM | Admit: 2020-11-18 | Discharge: 2020-11-18 | Discharge status: Against Medical Advice | Payer: Managed Care, Other (non HMO)

## 2020-11-18 ENCOUNTER — Emergency Department (HOSPITAL_BASED_OUTPATIENT_CLINIC_OR_DEPARTMENT_OTHER)
Admission: EM | Admit: 2020-11-18 | Discharge: 2020-11-18 | Disposition: A | Discharge status: Home / Self Care | Payer: No Typology Code available for payment source | Attending: Emergency Medicine | Admitting: Emergency Medicine

## 2020-11-18 DIAGNOSIS — J45909 Unspecified asthma, uncomplicated: Secondary | ICD-10-CM | POA: Insufficient documentation

## 2020-11-18 DIAGNOSIS — M5416 Radiculopathy, lumbar region: Secondary | ICD-10-CM | POA: Diagnosis not present

## 2020-11-18 DIAGNOSIS — M545 Low back pain, unspecified: Secondary | ICD-10-CM | POA: Diagnosis present

## 2020-11-18 DIAGNOSIS — Z79899 Other long term (current) drug therapy: Secondary | ICD-10-CM | POA: Insufficient documentation

## 2020-11-18 DIAGNOSIS — Z87891 Personal history of nicotine dependence: Secondary | ICD-10-CM | POA: Diagnosis not present

## 2020-11-18 MED ORDER — NAPROXEN 375 MG PO TABS: ORAL_TABLET | ORAL | 0 refills | Status: DC

## 2020-11-18 MED ORDER — OXYCODONE-ACETAMINOPHEN 5-325 MG PO TABS: 1.0000 | ORAL_TABLET | Freq: Four times a day (QID) | ORAL | 0 refills | Status: DC | PRN

## 2020-11-18 NOTE — ED Provider Notes (Signed)
[facility] Provider Note: Lowella Dell, MD, FACEP  CSN: [id number] MRN: [id number] ARRIVAL: 11/18/20 at 0029 ROOM: [facility]   CHIEF COMPLAINT  Back Pain   HISTORY OF PRESENT ILLNESS  11/18/20 12:48 AM Donald Fisher is a 29 y.o. male with 2 days of left-sided lower back pain radiating down the left leg in about the L3 or L4 dermatome.  He describes the pain is sharp and rates it as a 9 out of 10 at rest it is a 10 out of 10 when ambulating.  He has taken Tylenol without relief.  He denies any injury or inciting event.  He denies bowel or bladder changes.   Past Medical History:  Diagnosis Date   Asthma    hx of    Bronchitis    GERD (gastroesophageal reflux disease)    Heart murmur    hx of    Pneumonia    hx of     Past Surgical History:  Procedure Laterality Date   SHOULDER ARTHROSCOPY WITH LABRAL REPAIR Right 07/21/2016   Procedure: RIGHT SHOULDER ARTHROSCOPY WITH LABRAL REPAIR;  Surgeon: Kathryne Hitch, MD;  Location: [facility];  Service: Orthopedics;  Laterality: Right;  Shoulder Block    Family History  Problem Relation Age of Onset   Hypertension Mother    Congestive Heart Failure Maternal Grandmother    Hypertension Maternal Grandmother     Social History   Tobacco Use   Smoking status: Former Smoker    Packs/day: 0.50    Years: 10.00    Pack years: 5.00    Types: Cigarettes   Smokeless tobacco: Never Used  Building services engineer Use: Never used  Substance Use Topics   Alcohol use: Yes    Comment: occasionally   Drug use: Yes    Types: Marijuana    Prior to Admission medications   Medication Sig Start Date End Date Taking? Authorizing Provider  naproxen (NAPROSYN) 375 MG tablet Take 1 tablet twice daily as needed for back pain. 11/18/20   Samyia Motter, MD  oxyCODONE-acetaminophen (PERCOCET) 5-325 MG tablet Take 1 tablet by mouth every 6 (six) hours as needed for severe pain. 11/18/20   Judeth Gilles, MD   dicyclomine (BENTYL) 20 MG tablet Take 1 tablet (20 mg total) by mouth 2 (two) times daily as needed for spasms. Patient not taking: Reported on 07/28/2020 09/21/17 09/28/20  Charlynne Pander, MD    Allergies Bee venom, Shellfish allergy, Penicillins, and Betadine [povidone iodine]   REVIEW OF SYSTEMS  Negative except as noted here or in the History of Present Illness.   PHYSICAL EXAMINATION  Initial Vital Signs Blood pressure 132/78, pulse 86, temperature 98.6 F (37 C), temperature source Oral, resp. rate 18, height 5\' 7"  (1.702 m), weight 108.9 kg, SpO2 99 %.  Examination General: Well-developed, well-nourished male in no acute distress; appearance consistent with age of record HENT: normocephalic; atraumatic Eyes: Normal appearance Neck: supple Heart: regular rate and rhythm Lungs: clear to auscultation bilaterally Abdomen: soft; nondistended; nontender  Back: Left paralumbar tenderness Extremities: No deformity; pulses normal; decreased range of motion of left hip Neurologic: Awake, alert and oriented; motor function intact in all extremities and symmetric; sensation intact and symmetric in lower extremities; no facial droop Skin: Warm and dry Psychiatric: Normal mood and affect   RESULTS  Summary of this visit's results, reviewed and interpreted by myself:   EKG Interpretation  Date/Time:    Ventricular Rate:    PR Interval:  QRS Duration:   QT Interval:    QTC Calculation:   R Axis:     Text Interpretation:        Laboratory Studies: No results found for this or any previous visit (from the past 24 hour(s)). Imaging Studies: No results found.  ED COURSE and MDM  Nursing notes, initial and subsequent vitals signs, including pulse oximetry, reviewed and interpreted by myself.  Vitals:   11/18/20 0037  BP: 132/78  Pulse: 86  Resp: 18  Temp: 98.6 F (37 C)  TempSrc: Oral  SpO2: 99%  Weight: 108.9 kg  Height: 5\' 7"  (1.702 m)   Medications -  No data to display  Symptoms consistent with acute lumbar radiculopathy.  We will treat with an NSAID and narcotic and refer to sports medicine if symptoms persist.  PROCEDURES  Procedures   ED DIAGNOSES     ICD-10-CM   1. Lumbar radiculopathy  M54.16        Amaurie Wandel, , MD 11/18/20 [phone]

## 2020-11-18 NOTE — ED Notes (Signed)
Pt left due to not being seen by triage quick enough 

## 2020-11-18 NOTE — ED Triage Notes (Signed)
Left sided lower back pain radiating down into leg x 2 days. Increased pain with sitting and walking. Denies known injury

## 2020-11-18 NOTE — ED Notes (Signed)
Pt states pain in back goes down leg for last two days

## 2020-11-22 ENCOUNTER — Other Ambulatory Visit: Payer: Self-pay

## 2020-11-22 ENCOUNTER — Ambulatory Visit (INDEPENDENT_AMBULATORY_CARE_PROVIDER_SITE_OTHER): Payer: No Typology Code available for payment source | Admitting: Family Medicine

## 2020-11-22 VITALS — BP 120/82 | Ht 67.0 in | Wt 240.0 lb

## 2020-11-22 DIAGNOSIS — G2589 Other specified extrapyramidal and movement disorders: Secondary | ICD-10-CM | POA: Insufficient documentation

## 2020-11-22 MED ORDER — PREDNISONE 5 MG PO TABS: ORAL_TABLET | ORAL | 0 refills | Status: AC

## 2020-11-22 NOTE — Patient Instructions (Signed)
Nice to meet you Please try the exercises  Please try heat  Please do not use the naproxen while using the prednisone.   Please send me a message in MyChart with any questions or updates.  Please see me back in 2-3 weeks.   --Dr. Jordan Likes

## 2020-11-22 NOTE — Assessment & Plan Note (Addendum)
Initially had lower back pain but now presents more of left-sided periscapular pain with radicular type symptoms. -Counseled on home exercise therapy and supportive care. -Prednisone. - sling -Could consider physical therapy.

## 2020-11-22 NOTE — Progress Notes (Signed)
Donald Fisher - 29 y.o. male MRN [id number]  Date of birth: Jan 01, 1991  SUBJECTIVE:  Including CC & ROS.  No chief complaint on file.   Donald Fisher is a 29 y.o. male that is presenting with left-sided scapular pain and left arm pain.  He was seen in the emergency department on 12/16 and had more low back and radicular type leg pain.  He demonstrates more upper back and periscapular pain and left arm pain now.  Denies any specific injury or inciting event.  Has more pain with lying down.  No history of left shoulder surgery.  Has been taking the naproxen and pain medication with some improvement.  He is a delivery driver.  He denies working on a regular basis.   Review of Systems See HPI   HISTORY: Past Medical, Surgical, Social, and Family History Reviewed & Updated per EMR.   Pertinent Historical Findings include:  Past Medical History:  Diagnosis Date  . Asthma    hx of   . Bronchitis   . GERD (gastroesophageal reflux disease)   . Heart murmur    hx of   . Pneumonia    hx of     Past Surgical History:  Procedure Laterality Date  . SHOULDER ARTHROSCOPY WITH LABRAL REPAIR Right 07/21/2016   Procedure: RIGHT SHOULDER ARTHROSCOPY WITH LABRAL REPAIR;  Surgeon: Kathryne Hitch, MD;  Location: [facility];  Service: Orthopedics;  Laterality: Right;  Shoulder Block    Family History  Problem Relation Age of Onset  . Hypertension Mother   . Congestive Heart Failure Maternal Grandmother   . Hypertension Maternal Grandmother     Social History   Socioeconomic History  . Marital status: Single    Spouse name: Not on file  . Number of children: Not on file  . Years of education: Not on file  . Highest education level: Not on file  Occupational History  . Not on file  Tobacco Use  . Smoking status: Former Smoker    Packs/day: 0.50    Years: 10.00    Pack years: 5.00    Types: Cigarettes  . Smokeless tobacco: Never Used  Vaping Use  . Vaping Use: Never used   Substance and Sexual Activity  . Alcohol use: Yes    Comment: occasionally  . Drug use: Yes    Types: Marijuana  . Sexual activity: Never  Other Topics Concern  . Not on file  Social History Narrative  . Not on file   Social Determinants of Health   Financial Resource Strain: Not on file  Food Insecurity: Not on file  Transportation Needs: Not on file  Physical Activity: Not on file  Stress: Not on file  Social Connections: Not on file  Intimate Partner Violence: Not on file     PHYSICAL EXAM:  VS: BP 120/82   Ht 5\' 7"  (1.702 m)   Wt 240 lb (108.9 kg)   BMI 37.59 kg/m  Physical Exam Gen: NAD, alert, cooperative with exam, well-appearing MSK:  Normal normal extension of the back. Normal flexion of the back. Normal strength with hip flexion. Left shoulder: No winging of the scapula. Normal range of motion. Some tenderness to palpation that is periscapular in nature. Neurovascular intact.      ASSESSMENT & PLAN:   Scapular dyskinesis Initially had lower back pain but now presents more of left-sided periscapular pain with radicular type symptoms. -Counseled on home exercise therapy and supportive care. -Prednisone. - sling -Could consider  physical therapy.

## 2020-12-06 ENCOUNTER — Ambulatory Visit: Payer: No Typology Code available for payment source | Admitting: Family Medicine

## 2020-12-06 NOTE — Progress Notes (Deleted)
  Donald Fisher - 29 y.o. male MRN [id number]  Date of birth: Jun 02, 1991  SUBJECTIVE:  Including CC & ROS.  No chief complaint on file.   Donald Fisher is a 30 y.o. male that is  ***.  ***   Review of Systems See HPI   HISTORY: Past Medical, Surgical, Social, and Family History Reviewed & Updated per EMR.   Pertinent Historical Findings include:  Past Medical History:  Diagnosis Date  . Asthma    hx of   . Bronchitis   . GERD (gastroesophageal reflux disease)   . Heart murmur    hx of   . Pneumonia    hx of     Past Surgical History:  Procedure Laterality Date  . SHOULDER ARTHROSCOPY WITH LABRAL REPAIR Right 07/21/2016   Procedure: RIGHT SHOULDER ARTHROSCOPY WITH LABRAL REPAIR;  Surgeon: Kathryne Hitch, MD;  Location: [facility];  Service: Orthopedics;  Laterality: Right;  Shoulder Block    Family History  Problem Relation Age of Onset  . Hypertension Mother   . Congestive Heart Failure Maternal Grandmother   . Hypertension Maternal Grandmother     Social History   Socioeconomic History  . Marital status: Single    Spouse name: Not on file  . Number of children: Not on file  . Years of education: Not on file  . Highest education level: Not on file  Occupational History  . Not on file  Tobacco Use  . Smoking status: Former Smoker    Packs/day: 0.50    Years: 10.00    Pack years: 5.00    Types: Cigarettes  . Smokeless tobacco: Never Used  Vaping Use  . Vaping Use: Never used  Substance and Sexual Activity  . Alcohol use: Yes    Comment: occasionally  . Drug use: Yes    Types: Marijuana  . Sexual activity: Never  Other Topics Concern  . Not on file  Social History Narrative  . Not on file   Social Determinants of Health   Financial Resource Strain: Not on file  Food Insecurity: Not on file  Transportation Needs: Not on file  Physical Activity: Not on file  Stress: Not on file  Social Connections: Not on file  Intimate Partner Violence:  Not on file     PHYSICAL EXAM:  VS: There were no vitals taken for this visit. Physical Exam Gen: NAD, alert, cooperative with exam, well-appearing MSK:  ***      ASSESSMENT & PLAN:   No problem-specific Assessment & Plan notes found for this encounter.

## 2020-12-08 ENCOUNTER — Telehealth: Payer: Self-pay | Admitting: Family Medicine

## 2021-05-01 ENCOUNTER — Emergency Department (HOSPITAL_COMMUNITY)
Admission: EM | Admit: 2021-05-01 | Discharge: 2021-05-01 | Disposition: A | Discharge status: Home / Self Care | Payer: PRIVATE HEALTH INSURANCE | Attending: Emergency Medicine | Admitting: Emergency Medicine

## 2021-05-01 ENCOUNTER — Encounter (HOSPITAL_COMMUNITY): Payer: Self-pay | Admitting: Emergency Medicine

## 2021-05-01 ENCOUNTER — Other Ambulatory Visit: Payer: Self-pay

## 2021-05-01 DIAGNOSIS — L0291 Cutaneous abscess, unspecified: Secondary | ICD-10-CM

## 2021-05-01 DIAGNOSIS — L0201 Cutaneous abscess of face: Secondary | ICD-10-CM | POA: Insufficient documentation

## 2021-05-01 DIAGNOSIS — J45909 Unspecified asthma, uncomplicated: Secondary | ICD-10-CM | POA: Diagnosis not present

## 2021-05-01 DIAGNOSIS — Z87891 Personal history of nicotine dependence: Secondary | ICD-10-CM | POA: Diagnosis not present

## 2021-05-01 MED ORDER — LIDOCAINE HCL (PF) 1 % IJ SOLN
5.0000 mL | Freq: Once | INTRAMUSCULAR | Status: AC
  Administered 2021-05-01: 5 mL via INTRADERMAL

## 2021-05-01 MED ORDER — CEPHALEXIN 500 MG PO CAPS: 500.0000 mg | ORAL_CAPSULE | Freq: Two times a day (BID) | ORAL | 0 refills | Status: AC

## 2021-05-01 NOTE — ED Provider Notes (Signed)
[facility] EMERGENCY DEPARTMENT Provider Note   CSN: [id number] Arrival date & time: 05/01/21  1007     History No chief complaint on file.   Donald Fisher is a 30 y.o. male. Who presents emergency department with a chief complaint of swelling under the right eye.  Patient noticed swelling under his right eye about 2 months ago however over the past week it has gotten progressively more swollen, painful.  At times it causes his eyelid to close especially in the morning.  He has a history of recurrent abscesses.  He has no issues with tearing, no changes in vision, no eye globe pain.,  No pain with eye movement. HPI     Past Medical History:  Diagnosis Date  . Asthma    hx of   . Bronchitis   . GERD (gastroesophageal reflux disease)   . Heart murmur    hx of   . Pneumonia    hx of     Patient Active Problem List   Diagnosis Date Noted  . 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 Status post labral repair of shoulder 09/25/2016  . Asthma attack 09/15/2016  . Abdominal pain 09/15/2016    Past Surgical History:  Procedure Laterality Date  . SHOULDER ARTHROSCOPY WITH LABRAL REPAIR Right 07/21/2016   Procedure: RIGHT SHOULDER ARTHROSCOPY WITH LABRAL REPAIR;  Surgeon: Kathryne Hitch, MD;  Location: [facility];  Service: Orthopedics;  Laterality: Right;  Shoulder Block       Family History  Problem Relation Age of Onset  . Hypertension Mother   . Congestive Heart Failure Maternal Grandmother   . Hypertension Maternal Grandmother     Social History   Tobacco Use  . Smoking status: Former Smoker    Packs/day: 0.50    Years: 10.00    Pack years: 5.00    Types: Cigarettes  . Smokeless tobacco: Never Used  Vaping Use  . Vaping Use: Never used  Substance Use Topics  . Alcohol use: Yes    Comment: occasionally  . Drug use: Yes    Types: Marijuana    Home Medications Prior to Admission medications   Medication Sig Start Date End Date Taking?  Authorizing Provider  naproxen (NAPROSYN) 375 MG tablet Take 1 tablet twice daily as needed for back pain. 11/18/20   Molpus, John, MD  oxyCODONE-acetaminophen (PERCOCET) 5-325 MG tablet Take 1 tablet by mouth every 6 (six) hours as needed for severe pain. 11/18/20   Molpus, Jonny Ruiz, MD  predniSONE (DELTASONE) 5 MG tablet Take 6 pills for first day, 5 pills second day, 4 pills third day, 3 pills fourth day, 2 pills the fifth day, and 1 pill sixth day. 11/22/20   Myra Rude, MD  dicyclomine (BENTYL) 20 MG tablet Take 1 tablet (20 mg total) by mouth 2 (two) times daily as needed for spasms. Patient not taking: Reported on 07/28/2020 09/21/17 09/28/20  Charlynne Pander, MD    Allergies    Bee venom, Shellfish allergy, Penicillins, and Betadine [povidone iodine]  Review of Systems   Review of Systems  Constitutional: Negative for chills and fever.  Eyes: Negative for photophobia, pain, discharge, redness and visual disturbance.    Physical Exam Updated Vital Signs BP 135/87 (BP Location: Right Arm)   Pulse 90   Temp 97.9 F (36.6 C)   Resp 18   SpO2 96%   Physical Exam HENT:     Head: Normocephalic and atraumatic.     Right Ear:  External ear normal.     Left Ear: External ear normal.     Nose: Nose normal.     Mouth/Throat:     Mouth: Mucous membranes are moist.  Eyes:     Extraocular Movements: Extraocular movements intact.     Conjunctiva/sclera: Conjunctivae normal.     Pupils: Pupils are equal, round, and reactive to light.     Comments: Well circumscribed, erythematous tender fluctuant mass inferior to the right lower lid  Cardiovascular:     Rate and Rhythm: Normal rate.  Pulmonary:     Effort: Pulmonary effort is normal.  Abdominal:     General: There is no distension.  Musculoskeletal:        General: Normal range of motion.     Cervical back: Normal range of motion and neck supple.  Skin:    General: Skin is warm and dry.  Neurological:     Mental Status:  He is alert and oriented to person, place, and time.     ED Results / Procedures / Treatments   Labs (all labs ordered are listed, but only abnormal results are displayed) Labs Reviewed - No data to display  EKG None  Radiology No results found.  Procedures .Marland KitchenIncision and Drainage  Date/Time: 05/01/2021 4:33 PM Performed by: Arthor Captain, PA-C Authorized by: Arthor Captain, PA-C   Consent:    Consent obtained:  Verbal   Consent given by:  Patient   Risks discussed:  Bleeding, incomplete drainage, pain and damage to other organs   Alternatives discussed:  No treatment Universal protocol:    Procedure explained and questions answered to patient or proxy's satisfaction: yes     Relevant documents present and verified: yes     Test results available : yes     Imaging studies available: yes     Required blood products, implants, devices, and special equipment available: yes     Site/side marked: yes     Immediately prior to procedure, a time out was called: yes     Patient identity confirmed:  Verbally with patient Location:    Type:  Abscess   Size:  2cm   Location:  Head   Head location:  Face Pre-procedure details:    Skin preparation:  Betadine Anesthesia:    Anesthesia method:  Local infiltration   Local anesthetic:  Lidocaine 1% w/o epi Procedure type:    Complexity:  Simple Procedure details:    Incision types:  Single straight   Incision depth:  Subcutaneous   Wound management:  Probed and deloculated, irrigated with saline and extensive cleaning   Drainage:  Purulent   Drainage amount:  Moderate   Wound treatment:  Wound left open Post-procedure details:    Procedure completion:  Tolerated well, no immediate complications    Medications Ordered in ED Medications  lidocaine (PF) (XYLOCAINE) 1 % injection 5 mL (has no administration in time range)    ED Course  I have reviewed the triage vital signs and the nursing notes.  Pertinent labs &  imaging results that were available during my care of the patient were reviewed by me and considered in my medical decision making (see chart for details).    MDM Rules/Calculators/A&P                         Patient with abscess of the left face below the left eyelid. The patient findings from purulent abscess.  Wound left open.  Follow-up  with ophthalmology due to proximity to the eyelid.  Will start on Keflex.  Discussed return precautions.  Patient was otherwise appropriate for discharge  Final Clinical Impression(s) / ED Diagnoses Final diagnoses:  None    Rx / DC Orders ED Discharge Orders    None       Arthor Captain, PA-C 05/01/21 1636    Tegeler, Canary Brim, MD 05/02/21 [phone]

## 2021-05-01 NOTE — Discharge Instructions (Addendum)
Contact a health care provider if: Your cyst or abscess returns. You have a fever or chills. You have more redness, swelling, or pain around your incision. You have more fluid or blood coming from your incision. Your incision feels warm to the touch. You have pus or a bad smell coming from your incision. You have red streaks above or below the incision site. Get help right away if: You have severe pain or bleeding. You cannot eat or drink without vomiting. You have decreased urine output. You become short of breath. You have chest pain. You cough up blood. The affected area becomes numb or starts to tingle. 

## 2021-05-01 NOTE — ED Triage Notes (Signed)
C/o swelling under R eye x 2 months.  Denies pain.  Using warm compresses without relief.

## 2021-05-09 ENCOUNTER — Encounter (HOSPITAL_COMMUNITY): Payer: Self-pay

## 2021-05-09 ENCOUNTER — Ambulatory Visit (HOSPITAL_COMMUNITY)
Admission: EM | Admit: 2021-05-09 | Discharge: 2021-05-09 | Disposition: A | Discharge status: Home / Self Care | Payer: PRIVATE HEALTH INSURANCE | Attending: Family Medicine | Admitting: Family Medicine

## 2021-05-09 DIAGNOSIS — K0889 Other specified disorders of teeth and supporting structures: Secondary | ICD-10-CM

## 2021-05-09 MED ORDER — CEFTRIAXONE SODIUM 1 G IJ SOLR
1.0000 g | Freq: Once | INTRAMUSCULAR | Status: AC
  Administered 2021-05-09: 1 g via INTRAMUSCULAR

## 2021-05-09 MED ORDER — KETOROLAC TROMETHAMINE 60 MG/2ML IM SOLN
60.0000 mg | Freq: Once | INTRAMUSCULAR | Status: AC
  Administered 2021-05-09: 60 mg via INTRAMUSCULAR

## 2021-05-09 MED ORDER — HYDROCODONE-ACETAMINOPHEN 5-325 MG PO TABS
ORAL_TABLET | ORAL | Status: AC
  Filled 2021-05-09: qty 1

## 2021-05-09 MED ORDER — KETOROLAC TROMETHAMINE 60 MG/2ML IM SOLN
INTRAMUSCULAR | Status: AC
  Filled 2021-05-09: qty 2

## 2021-05-09 MED ORDER — CLINDAMYCIN HCL 300 MG PO CAPS: 300.0000 mg | ORAL_CAPSULE | Freq: Three times a day (TID) | ORAL | 0 refills | Status: DC

## 2021-05-09 MED ORDER — CLINDAMYCIN HCL 300 MG PO CAPS: 300.0000 mg | ORAL_CAPSULE | Freq: Three times a day (TID) | ORAL | 0 refills | Status: AC

## 2021-05-09 MED ORDER — CEFTRIAXONE SODIUM 1 G IJ SOLR
INTRAMUSCULAR | Status: AC
  Filled 2021-05-09: qty 10

## 2021-05-09 MED ORDER — OXYCODONE-ACETAMINOPHEN 5-325 MG PO TABS: 1.0000 | ORAL_TABLET | Freq: Four times a day (QID) | ORAL | 0 refills | Status: AC | PRN

## 2021-05-09 MED ORDER — OXYCODONE-ACETAMINOPHEN 5-325 MG PO TABS: 1.0000 | ORAL_TABLET | Freq: Four times a day (QID) | ORAL | 0 refills | Status: DC | PRN

## 2021-05-09 MED ORDER — HYDROCODONE-ACETAMINOPHEN 5-325 MG PO TABS
1.0000 | ORAL_TABLET | Freq: Once | ORAL | Status: AC
  Administered 2021-05-09: 1 via ORAL

## 2021-05-09 MED ORDER — LIDOCAINE HCL (PF) 1 % IJ SOLN
INTRAMUSCULAR | Status: AC
  Filled 2021-05-09: qty 2

## 2021-05-09 NOTE — ED Triage Notes (Signed)
Pt in with c/o right side tooth pain and facial swelling x 2 days  Pt has been taking advil with no relief

## 2021-05-09 NOTE — ED Notes (Signed)
Spoke with pharmacist at  [facility] [address]. Rx for control substance was cancelled and resent to [facility] [address].

## 2021-05-09 NOTE — ED Provider Notes (Signed)
[facility]   [id number] 05/09/21 Arrival Time: 1093  ASSESSMENT & PLAN:  1. Pain, dental    No sign of abscess requiring I&D at this time. Discussed.  Meds ordered this encounter  Medications  . ketorolac (TORADOL) injection 60 mg  . cefTRIAXone (ROCEPHIN) injection 1 g  . HYDROcodone-acetaminophen (NORCO/VICODIN) 5-325 MG per tablet 1 tablet  . oxyCODONE-acetaminophen (PERCOCET/ROXICET) 5-325 MG tablet    Sig: Take 1 tablet by mouth every 6 (six) hours as needed for severe pain.    Dispense:  12 tablet    Refill:  0  . clindamycin (CLEOCIN) 300 MG capsule    Sig: Take 1 capsule (300 mg total) by mouth 3 (three) times daily.    Dispense:  30 capsule    Refill:  0   [place] Controlled Substances Registry consulted for this patient. I feel the risk/benefit ratio today is favorable for proceeding with this prescription for a controlled substance. Medication sedation precautions given.  Dental resource written instructions given. He will schedule dental evaluation as soon as possible if not improving over the next 24-48 hours.   Follow-up Information    [facility] EMERGENCY DEPARTMENT.   Specialty: Emergency Medicine Why: If symptoms worsen in any way. Contact information: [address] [id number] [place] [address] [phone]             Reviewed expectations re: course of current medical issues. Questions answered. Outlined signs and symptoms indicating need for more acute intervention. Patient verbalized understanding. After Visit Summary given.   SUBJECTIVE:  Donald Fisher is a 30 y.o. male who reports gradual onset of right lower dental pain described as "severe throbbing and aching". Present for 2-3 days. Fever: absent. Tolerating PO intake but reports significant pain with chewing. Normal swallowing. He does not see a dentist regularly. No neck swelling or pain. OTC analgesics without  relief.  OBJECTIVE: Vitals:   05/09/21 0951  BP: 131/75  Pulse: 85  Resp: 16  Temp: 98.3 F (36.8 C)  TempSrc: Oral  SpO2: 96%    General appearance: alert; no distress HENT: normocephalic; atraumatic; dentition: fair; right rear lower gum without areas of fluctuance, drainage, or bleeding and with significant tenderness to palpation; normal jaw movement without difficulty Neck: supple without LAD; FROM; trachea midline Lungs: normal respirations; unlabored; speaks full sentences without difficulty Skin: warm and dry Psychological: alert and cooperative; normal mood and affect  Allergies  Allergen Reactions  . Bee Venom Anaphylaxis, Hives, Shortness Of Breath and Other (See Comments)    Wheezing and skin redness, also  . Shellfish Allergy Anaphylaxis and Swelling    NO OYSTERS!! Throat swells  . Penicillins Hives    Has patient had a PCN reaction causing immediate rash, facial/tongue/throat swelling, SOB or lightheadedness with hypotension: Yes Has patient had a PCN reaction causing severe rash involving mucus membranes or skin necrosis: No Has patient had a PCN reaction that required hospitalization: No Has patient had a PCN reaction occurring within the last 10 years: Yes If all of the above answers are "NO", then may proceed with Cephalosporin use.   . Betadine [Povidone Iodine] Rash and Other (See Comments)    Allergy since childhood    Past Medical History:  Diagnosis Date  . Asthma    hx of   . Bronchitis   . GERD (gastroesophageal reflux disease)   . Heart murmur    hx of   . Pneumonia    hx of  Social History   Socioeconomic History  . Marital status: Single    Spouse name: Not on file  . Number of children: Not on file  . Years of education: Not on file  . Highest education level: Not on file  Occupational History  . Not on file  Tobacco Use  . Smoking status: Former Smoker    Packs/day: 0.50    Years: 10.00    Pack years: 5.00    Types:  Cigarettes  . Smokeless tobacco: Never Used  Vaping Use  . Vaping Use: Never used  Substance and Sexual Activity  . Alcohol use: Yes    Comment: occasionally  . Drug use: Yes    Types: Marijuana  . Sexual activity: Never  Other Topics Concern  . Not on file  Social History Narrative  . Not on file   Social Determinants of Health   Financial Resource Strain: Not on file  Food Insecurity: Not on file  Transportation Needs: Not on file  Physical Activity: Not on file  Stress: Not on file  Social Connections: Not on file  Intimate Partner Violence: Not on file   Family History  Problem Relation Age of Onset  . Hypertension Mother   . Congestive Heart Failure Maternal Grandmother   . Hypertension Maternal Grandmother    Past Surgical History:  Procedure Laterality Date  . SHOULDER ARTHROSCOPY WITH LABRAL REPAIR Right 07/21/2016   Procedure: RIGHT SHOULDER ARTHROSCOPY WITH LABRAL REPAIR;  Surgeon: Kathryne Hitch, MD;  Location: [facility];  Service: Orthopedics;  Laterality: Right;  Shoulder Block     Mardella Layman, MD 05/09/21 1017

## 2021-05-09 NOTE — Discharge Instructions (Signed)

## 2021-09-13 ENCOUNTER — Ambulatory Visit (HOSPITAL_COMMUNITY)
Admission: EM | Admit: 2021-09-13 | Discharge: 2021-09-13 | Disposition: A | Discharge status: Home / Self Care | Payer: PRIVATE HEALTH INSURANCE | Attending: Internal Medicine | Admitting: Internal Medicine

## 2021-09-13 ENCOUNTER — Other Ambulatory Visit: Payer: Self-pay

## 2021-09-13 ENCOUNTER — Encounter (HOSPITAL_COMMUNITY): Payer: Self-pay | Admitting: Emergency Medicine

## 2021-09-13 DIAGNOSIS — M545 Low back pain, unspecified: Secondary | ICD-10-CM | POA: Diagnosis not present

## 2021-09-13 LAB — POCT URINALYSIS DIPSTICK, ED / UC
Bilirubin Urine: NEGATIVE
Glucose, UA: NEGATIVE mg/dL
Hgb urine dipstick: NEGATIVE
Ketones, ur: NEGATIVE mg/dL
Leukocytes,Ua: NEGATIVE
Nitrite: NEGATIVE
Protein, ur: NEGATIVE mg/dL
Specific Gravity, Urine: 1.025 (ref 1.005–1.030)
Urobilinogen, UA: 0.2 mg/dL (ref 0.0–1.0)
pH: 7 (ref 5.0–8.0)

## 2021-09-13 NOTE — Discharge Instructions (Signed)
You can take Tylenol and/or Ibuprofen as needed for pain relief and fever reduction.   You can use heat, ice, or alternate between heat and ice for comfort.  You can use IcyHot, lidocaine patches, biofreeze, aspercreme, or voltaren gel as needed for pain relief.   Make sure you are drinking plenty of fluids, especially water.    Follow up with your primary care provider or orthopedics if your back pain does not improve in the next week.

## 2021-09-13 NOTE — ED Triage Notes (Signed)
Lower back pain started 3 days ago.  Patient denies stinging with urination, but it "feels funny". Patient thinks he may have seen blood in urine.   Patient has taken bayer back and body medicine.  Pain was better while taking medicine

## 2021-09-13 NOTE — ED Notes (Signed)
Urine specimen in lab 

## 2021-09-13 NOTE — ED Provider Notes (Signed)
[facility]    CSN: [id number] Arrival date & time: 09/13/21  1057      History   Chief Complaint Chief Complaint  Patient presents with   Back Pain    HPI DEMONI Fisher is a 30 y.o. male.   Patient here for evaluation of lower back pain that has been ongoing for the past 3 days.  Denies any burning with urination but does report that it "feels funny".  Also does report urinary frequency.  Reports taking bayer back and body which did help with lower back pain.  Denies any trauma, injury, or other precipitating event.  Denies any specific alleviating or aggravating factors.  Denies any fevers, chest pain, shortness of breath, N/V/D, numbness, tingling, weakness, abdominal pain, or headaches.    The history is provided by the patient.  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 Authorizing Provider  clindamycin (CLEOCIN) 300 MG capsule Take 1 capsule (300 mg total) by mouth 3 (three) times daily. Patient not taking: Reported on 09/13/2021 05/09/21   Mardella Layman, MD  naproxen (NAPROSYN) 375 MG tablet Take 1 tablet twice daily as needed for back pain. Patient not taking: Reported on 05/01/2021 11/18/20   Molpus, John,  MD  oxyCODONE-acetaminophen (PERCOCET/ROXICET) 5-325 MG tablet Take 1 tablet by mouth every 6 (six) hours as needed for severe pain. Patient not taking: Reported on 09/13/2021 05/09/21   Mardella Layman, MD  predniSONE (DELTASONE) 5 MG tablet Take 6 pills for first day, 5 pills second day, 4 pills third day, 3 pills fourth day, 2 pills the fifth day, and 1 pill sixth day. Patient not taking: Reported on 05/01/2021 11/22/20   Myra Rude, MD  dicyclomine (BENTYL) 20 MG tablet Take 1 tablet (20 mg total) by mouth 2 (two) times daily as needed for spasms. Patient not taking: Reported on 07/28/2020 09/21/17 09/28/20  Charlynne Pander, MD    Family History Family History  Problem Relation Age of Onset   Hypertension Mother    Congestive Heart Failure Maternal Grandmother    Hypertension Maternal Grandmother     Social History Social History   Tobacco Use   Smoking status: Former    Packs/day: 0.50    Years: 10.00    Pack years: 5.00    Types: Cigarettes   Smokeless tobacco: Never  Vaping Use   Vaping Use: Never used  Substance Use Topics   Alcohol use: Yes    Comment: occasionally   Drug use: Yes    Types: Marijuana     Allergies   Bee venom, Shellfish allergy, Penicillins, and  Betadine [povidone iodine]   Review of Systems Review of Systems  Genitourinary:  Positive for frequency. Negative for dysuria and penile discharge.  Musculoskeletal:  Positive for back pain.  All other systems reviewed and are negative.   Physical Exam Triage Vital Signs ED Triage Vitals  Enc Vitals Group     BP 09/13/21 1142 138/81     Pulse Rate 09/13/21 1142 88     Resp 09/13/21 1142 (!) 22     Temp 09/13/21 1142 98 F (36.7 C)     Temp Source 09/13/21 1142 Oral     SpO2 09/13/21 1142 96 %     Weight --      Height --      Head Circumference --      Peak Flow --      Pain Score 09/13/21 1140 8     Pain Loc --      Pain Edu? --      Excl. in GC? --    No data  found.  Updated Vital Signs BP 138/81 (BP Location: Left Arm) Comment (BP Location): large cuff  Pulse 88   Temp 98 F (36.7 C) (Oral)   Resp (!) 22   SpO2 96%   Visual Acuity Right Eye Distance:   Left Eye Distance:   Bilateral Distance:    Right Eye Near:   Left Eye Near:    Bilateral Near:     Physical Exam Vitals and nursing note reviewed.  Constitutional:      General: He is not in acute distress.    Appearance: Normal appearance. He is not ill-appearing, toxic-appearing or diaphoretic.  HENT:     Head: Normocephalic and atraumatic.  Eyes:     Conjunctiva/sclera: Conjunctivae normal.  Cardiovascular:     Rate and Rhythm: Normal rate.     Pulses: Normal pulses.  Pulmonary:     Effort: Pulmonary effort is normal.  Abdominal:     General: Abdomen is flat.  Musculoskeletal:        General: Normal range of motion.     Cervical back: Normal range of motion.     Lumbar back: Tenderness present. No bony tenderness. Normal range of motion. Negative right straight leg raise test and negative left straight leg raise test.  Skin:    General: Skin is warm and dry.  Neurological:     General: No focal deficit present.     Mental Status: He is alert and oriented to person, place, and time.  Psychiatric:        Mood and Affect: Mood normal.     UC Treatments / Results  Labs (all labs ordered are listed, but only abnormal results are displayed) Labs Reviewed  POCT URINALYSIS DIPSTICK, ED / UC    EKG   Radiology No results found.  Procedures Procedures (including critical care time)  Medications Ordered in UC Medications - No data to display  Initial Impression / Assessment and Plan / UC Course  I have reviewed the triage vital signs and the nursing notes.  Pertinent labs & imaging results that were available during my care of the patient were reviewed by me and considered in my medical decision making (see chart for details).    Assessment negative for  red flags or concerns.  Urinalysis within normal limits with no signs of infection.  Patient declined STD testing at this time.  Back pain likely musculoskeletal in nature.  Recommend Tylenol and/or ibuprofen as needed.  May use  heat, ice, or OTC pain relievers.  Encourage fluids and rest.  Follow-up with PCP or orthopedics if symptoms do not improve in the next week. Final Clinical Impressions(s) / UC Diagnoses   Final diagnoses:  Acute bilateral low back pain without sciatica     Discharge Instructions      You can take Tylenol and/or Ibuprofen as needed for pain relief and fever reduction.   You can use heat, ice, or alternate between heat and ice for comfort.  You can use IcyHot, lidocaine patches, biofreeze, aspercreme, or voltaren gel as needed for pain relief.   Make sure you are drinking plenty of fluids, especially water.    Follow up with your primary care provider or orthopedics if your back pain does not improve in the next week.      ED Prescriptions   None    PDMP not reviewed this encounter.   Ivette Loyal, NP 09/13/21 [phone]

## 2021-10-16 ENCOUNTER — Emergency Department (HOSPITAL_COMMUNITY): Payer: Managed Care, Other (non HMO)

## 2021-10-16 ENCOUNTER — Other Ambulatory Visit: Payer: Self-pay

## 2021-10-16 ENCOUNTER — Encounter (HOSPITAL_COMMUNITY): Payer: Self-pay

## 2021-10-16 ENCOUNTER — Emergency Department (HOSPITAL_COMMUNITY)
Admission: EM | Admit: 2021-10-16 | Discharge: 2021-10-16 | Disposition: A | Discharge status: Home / Self Care | Payer: Managed Care, Other (non HMO) | Attending: Emergency Medicine | Admitting: Emergency Medicine

## 2021-10-16 DIAGNOSIS — Z87891 Personal history of nicotine dependence: Secondary | ICD-10-CM | POA: Diagnosis not present

## 2021-10-16 DIAGNOSIS — J45909 Unspecified asthma, uncomplicated: Secondary | ICD-10-CM | POA: Insufficient documentation

## 2021-10-16 DIAGNOSIS — Y92410 Unspecified street and highway as the place of occurrence of the external cause: Secondary | ICD-10-CM | POA: Insufficient documentation

## 2021-10-16 DIAGNOSIS — M545 Low back pain, unspecified: Secondary | ICD-10-CM | POA: Insufficient documentation

## 2021-10-16 MED ORDER — NAPROXEN 500 MG PO TABS
500.0000 mg | ORAL_TABLET | Freq: Once | ORAL | Status: AC
  Administered 2021-10-16: 500 mg via ORAL
  Filled 2021-10-16: qty 1

## 2021-10-16 MED ORDER — LIDOCAINE 5 % EX PTCH: 1.0000 | MEDICATED_PATCH | CUTANEOUS | 0 refills | Status: DC

## 2021-10-16 MED ORDER — ACETAMINOPHEN 500 MG PO TABS
1000.0000 mg | ORAL_TABLET | Freq: Once | ORAL | Status: AC
  Administered 2021-10-16: 1000 mg via ORAL
  Filled 2021-10-16: qty 2

## 2021-10-16 MED ORDER — NAPROXEN 375 MG PO TABS: 375.0000 mg | ORAL_TABLET | Freq: Two times a day (BID) | ORAL | 0 refills | Status: DC

## 2021-10-16 MED ORDER — LIDOCAINE 5 % EX PTCH
3.0000 | MEDICATED_PATCH | CUTANEOUS | Status: DC
  Administered 2021-10-16: 3 via TRANSDERMAL
  Filled 2021-10-16: qty 3
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
## 2021-10-16 NOTE — ED Notes (Addendum)
Pt angry when told meds ordered; pt has someone on speaker phone and states "I don't want no fucking tylenol or naproxen. I don't know why they give you that shit knowing it don't do shit"; this RN asked pt to clarify if he wants the medication ordered, pt states "yes I mean I'll take it but it ain't gonna do shit", pt shaking his leg and breathing heavily when lidocaine patches applied, respirations and O2 WNL, pt breathing normally when sat back up in bed, EDP aware

## 2021-10-18 ENCOUNTER — Other Ambulatory Visit: Payer: Self-pay

## 2021-10-18 ENCOUNTER — Ambulatory Visit (HOSPITAL_COMMUNITY)
Admission: EM | Admit: 2021-10-18 | Discharge: 2021-10-18 | Disposition: A | Discharge status: Home / Self Care | Payer: Managed Care, Other (non HMO) | Attending: Student | Admitting: Student

## 2021-10-18 ENCOUNTER — Encounter (HOSPITAL_COMMUNITY): Payer: Self-pay

## 2021-10-18 DIAGNOSIS — S39012A Strain of muscle, fascia and tendon of lower back, initial encounter: Secondary | ICD-10-CM

## 2021-10-18 MED ORDER — TIZANIDINE HCL 2 MG PO TABS: 2.0000 mg | ORAL_TABLET | Freq: Three times a day (TID) | ORAL | 0 refills | Status: AC | PRN
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
## 2021-10-18 NOTE — Discharge Instructions (Addendum)
-  Start the muscle relaxer-Zanaflex (tizanidine), up to 3 times daily for muscle spasms and pain.  This can make you drowsy, so take at bedtime or when you do not need to drive or operate machinery. -You can take Tylenol up to 1000 mg 3 times daily, and ibuprofen up to 600 mg 3 times daily with food.  You can take these together, or alternate every 3-4 hours. -Heating pad -Head to ED if unbearable pain
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
## 2021-11-01 ENCOUNTER — Ambulatory Visit: Payer: Managed Care, Other (non HMO) | Admitting: Critical Care Medicine

## 2021-11-01 NOTE — Progress Notes (Incomplete)
New Patient Office Visit  Subjective:  Patient ID: Donald Fisher, male    DOB: 15-Oct-1991  Age: 30 y.o. MRN: [id number]  CC: No chief complaint on file.   HPI Donald Fisher presents for  Flu pcv hcv  Past Medical History:  Diagnosis Date   Asthma    hx of    Bronchitis    GERD (gastroesophageal reflux disease)    Heart murmur    hx of    Pneumonia    hx of     Past Surgical History:  Procedure Laterality Date   SHOULDER ARTHROSCOPY WITH LABRAL REPAIR Right 07/21/2016   Procedure: RIGHT SHOULDER ARTHROSCOPY WITH LABRAL REPAIR;  Surgeon: Kathryne Hitch, MD;  Location: [facility];  Service: Orthopedics;  Laterality: Right;  Shoulder Block    Family History  Problem Relation Age of Onset   Hypertension Mother    Congestive Heart Failure Maternal Grandmother    Hypertension Maternal Grandmother     Social History   Socioeconomic History   Marital status: Single    Spouse name: Not on file   Number of children: Not on file   Years of education: Not on file   Highest education level: Not on file  Occupational History   Not on file  Tobacco Use   Smoking status: Former    Packs/day: 0.50    Years: 10.00    Pack years: 5.00    Types: Cigarettes   Smokeless tobacco: Never  Vaping Use   Vaping Use: Never used  Substance and Sexual Activity   Alcohol use: Yes    Comment: occasionally   Drug use: Yes    Types: Marijuana   Sexual activity: Never  Other Topics Concern   Not on file  Social History Narrative   Not on file   Social Determinants of Health   Financial Resource Strain: Not on file  Food Insecurity: Not on file  Transportation Needs: Not on file  Physical Activity: Not on file  Stress: Not on file  Social Connections: Not on file  Intimate Partner Violence: Not on file    ROS Review of Systems  Objective:   Today's Vitals: There were no vitals taken for this visit.  Physical Exam  Assessment & Plan:    Problem List Items Addressed This Visit   None   Outpatient Encounter Medications as of 11/01/2021  Medication Sig   clindamycin (CLEOCIN) 300 MG capsule Take 1 capsule (300 mg total) by mouth 3 (three) times daily. (Patient not taking: Reported on 09/13/2021)   lidocaine (LIDODERM) 5 % Place 1 patch onto the skin daily. Remove & Discard patch within 12 hours or as directed by MD   naproxen (NAPROSYN) 375 MG tablet Take 1 tablet twice daily as needed for back pain. (Patient not taking: Reported on 05/01/2021)   naproxen (NAPROSYN) 375 MG tablet Take 1 tablet (375 mg total) by mouth 2 (two) times daily with a meal.   oxyCODONE-acetaminophen (PERCOCET/ROXICET) 5-325 MG tablet Take 1 tablet by mouth every 6 (six) hours as needed for severe pain. (Patient not taking: Reported on 09/13/2021)   predniSONE (DELTASONE) 5 MG tablet Take 6 pills for first day, 5 pills second day, 4 pills third day, 3 pills fourth day, 2 pills the fifth day, and 1 pill sixth day. (Patient not taking: Reported on 05/01/2021)   tiZANidine (ZANAFLEX) 2 MG tablet Take 1 tablet (2 mg total) by mouth every 8 (eight) hours as needed for muscle spasms.   [  DISCONTINUED] dicyclomine (BENTYL) 20 MG tablet Take 1 tablet (20 mg total) by mouth 2 (two) times daily as needed for spasms. (Patient not taking: Reported on 07/28/2020)   No facility-administered encounter medications on file as of 11/01/2021.    Follow-up: No follow-ups on file.   Shan Levans, MD

## 2021-11-07 ENCOUNTER — Encounter (HOSPITAL_COMMUNITY): Payer: Self-pay | Admitting: Emergency Medicine

## 2021-11-07 ENCOUNTER — Emergency Department (HOSPITAL_COMMUNITY)
Admission: EM | Admit: 2021-11-07 | Discharge: 2021-11-07 | Disposition: A | Discharge status: Home / Self Care | Payer: Managed Care, Other (non HMO) | Attending: Emergency Medicine | Admitting: Emergency Medicine

## 2021-11-07 ENCOUNTER — Other Ambulatory Visit: Payer: Self-pay

## 2021-11-07 ENCOUNTER — Emergency Department (HOSPITAL_COMMUNITY): Payer: Managed Care, Other (non HMO)

## 2021-11-07 DIAGNOSIS — Z87891 Personal history of nicotine dependence: Secondary | ICD-10-CM | POA: Insufficient documentation

## 2021-11-07 DIAGNOSIS — J45909 Unspecified asthma, uncomplicated: Secondary | ICD-10-CM | POA: Insufficient documentation

## 2021-11-07 DIAGNOSIS — R197 Diarrhea, unspecified: Secondary | ICD-10-CM | POA: Diagnosis not present

## 2021-11-07 DIAGNOSIS — K219 Gastro-esophageal reflux disease without esophagitis: Secondary | ICD-10-CM | POA: Diagnosis not present

## 2021-11-07 DIAGNOSIS — R1084 Generalized abdominal pain: Secondary | ICD-10-CM

## 2021-11-07 DIAGNOSIS — R631 Polydipsia: Secondary | ICD-10-CM | POA: Diagnosis not present

## 2021-11-07 LAB — CBC WITH DIFFERENTIAL/PLATELET
Abs Immature Granulocytes: 0.02 10*3/uL (ref 0.00–0.07)
Basophils Absolute: 0 10*3/uL (ref 0.0–0.1)
Basophils Relative: 1 %
Eosinophils Absolute: 0.3 10*3/uL (ref 0.0–0.5)
Eosinophils Relative: 5 %
HCT: 48.2 % (ref 39.0–52.0)
Hemoglobin: 16 g/dL (ref 13.0–17.0)
Immature Granulocytes: 0 %
Lymphocytes Relative: 21 %
Lymphs Abs: 1.2 10*3/uL (ref 0.7–4.0)
MCH: 30 pg (ref 26.0–34.0)
MCHC: 33.2 g/dL (ref 30.0–36.0)
MCV: 90.3 fL (ref 80.0–100.0)
Monocytes Absolute: 0.7 10*3/uL (ref 0.1–1.0)
Monocytes Relative: 12 %
Neutro Abs: 3.5 10*3/uL (ref 1.7–7.7)
Neutrophils Relative %: 61 %
Platelets: 213 10*3/uL (ref 150–400)
RBC: 5.34 MIL/uL (ref 4.22–5.81)
RDW: 12.8 % (ref 11.5–15.5)
WBC: 5.8 10*3/uL (ref 4.0–10.5)
nRBC: 0 % (ref 0.0–0.2)

## 2021-11-07 LAB — COMPREHENSIVE METABOLIC PANEL
ALT: 52 U/L — ABNORMAL HIGH (ref 0–44)
AST: 42 U/L — ABNORMAL HIGH (ref 15–41)
Albumin: 3.5 g/dL (ref 3.5–5.0)
Alkaline Phosphatase: 65 U/L (ref 38–126)
Anion gap: 5 (ref 5–15)
BUN: 15 mg/dL (ref 6–20)
CO2: 23 mmol/L (ref 22–32)
Calcium: 8.5 mg/dL — ABNORMAL LOW (ref 8.9–10.3)
Chloride: 108 mmol/L (ref 98–111)
Creatinine, Ser: 1.08 mg/dL (ref 0.61–1.24)
GFR, Estimated: >60 mL/min (ref >60)
Glucose, Bld: 103 mg/dL — ABNORMAL HIGH (ref 70–99)
Potassium: 3.9 mmol/L (ref 3.5–5.1)
Sodium: 136 mmol/L (ref 135–145)
Total Bilirubin: 0.8 mg/dL (ref 0.3–1.2)
Total Protein: 6.2 g/dL — ABNORMAL LOW (ref 6.5–8.1)

## 2021-11-07 LAB — LIPASE, BLOOD: Lipase: 29 U/L (ref 11–51)

## 2021-11-07 MED ORDER — SODIUM CHLORIDE 0.9 % IV BOLUS
1000.0000 mL | Freq: Once | INTRAVENOUS | Status: AC
  Administered 2021-11-07: 1000 mL via INTRAVENOUS

## 2021-11-07 MED ORDER — ONDANSETRON HCL 4 MG/2ML IJ SOLN
4.0000 mg | Freq: Once | INTRAMUSCULAR | Status: AC
  Administered 2021-11-07: 4 mg via INTRAVENOUS
  Filled 2021-11-07: qty 2

## 2021-11-07 MED ORDER — IOHEXOL 300 MG/ML  SOLN
100.0000 mL | Freq: Once | INTRAMUSCULAR | Status: AC | PRN
  Administered 2021-11-07: 100 mL via INTRAVENOUS

## 2021-11-07 MED ORDER — ONDANSETRON 4 MG PO TBDP: 4.0000 mg | ORAL_TABLET | Freq: Three times a day (TID) | ORAL | 0 refills | Status: AC | PRN

## 2021-11-07 NOTE — ED Triage Notes (Signed)
Pt. Stated, Ive had dry mouth for week and half , diarrhea since Saturday, and my stomach started hurting this morning

## 2021-11-07 NOTE — Discharge Instructions (Signed)
Follow-up with a primary care doctor for the continued thirst.  Try and keep her self hydrated.  It looks as if you have been doing pretty well with that.

## 2021-11-07 NOTE — ED Provider Notes (Signed)
Emergency Medicine Provider Triage Evaluation Note  KELDAN EPLIN , a 30 y.o. male  was evaluated in triage.  States that he has had dry mouth for about 1 week.  Started having diarrhea on Saturday that is nonbloody.  He has had about 2-3 episodes of diarrhea daily since then.  He also has associated nausea and vomiting.  He has a decreased p.o. intake.  Abdominal pain started this morning.   Physical Exam  BP 117/80 (BP Location: Left Arm)   Pulse 84   Temp 98.2 F (36.8 C) (Oral)   Resp 16   SpO2 97%  Gen:   Awake, no distress   Resp:  Normal effort  MSK:   Moves extremities without difficulty  Other:  Generalized abdominal tenderness  Medical Decision Making  Medically screening exam initiated at 10:01 AM.  Appropriate orders placed.  Osha L Regner was informed that the remainder of the evaluation will be completed by another provider, this initial triage assessment does not replace that evaluation, and the importance of remaining in the ED until their evaluation is complete.     Therese Sarah 11/07/21 1002    Benjiman Core, MD 11/08/21 1231

## 2021-11-14 NOTE — ED Provider Notes (Signed)
[facility] EMERGENCY DEPARTMENT Provider Note   CSN: [id number] Arrival date & time: 11/07/21  0901     History Chief Complaint  Patient presents with   Abdominal Pain   Diarrhea    Donald Fisher is a 30 y.o. male.   Abdominal Pain Associated symptoms: diarrhea   Associated symptoms: no fever and no shortness of breath   Diarrhea Associated symptoms: abdominal pain   Associated symptoms: no fever   Patient states that for around a week and a half he has been feeling thirsty all the time.  However more recently than that he is started to have some diarrhea.  Some abdominal pain.  Benign abdominal exam.  States he is just felt extra thirsty.  Mild dull abdominal pain.  No history of diabetes.  States he is not urinating more frequently.    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   Status post labral repair of shoulder 09/25/2016   Asthma attack 09/15/2016   Abdominal pain 09/15/2016    Past Surgical History:  Procedure Laterality Date   SHOULDER ARTHROSCOPY WITH LABRAL REPAIR Right 07/21/2016   Procedure: RIGHT SHOULDER ARTHROSCOPY WITH LABRAL REPAIR;  Surgeon: Kathryne Hitch, MD;  Location: [facility];  Service: Orthopedics;  Laterality: Right;  Shoulder Block       Family History  Problem Relation Age of Onset   Hypertension Mother    Congestive Heart Failure Maternal Grandmother    Hypertension Maternal Grandmother     Social History   Tobacco Use   Smoking status: Former    Packs/day: 0.50    Years: 10.00    Pack years: 5.00    Types: Cigarettes   Smokeless tobacco: Never  Vaping Use   Vaping Use: Never used  Substance Use Topics   Alcohol use: Yes    Comment: occasionally   Drug use: Yes    Types: Marijuana     Home Medications Prior to Admission medications   Medication Sig Start Date End Date Taking? Authorizing Provider  ondansetron (ZOFRAN-ODT) 4 MG disintegrating tablet Take 1 tablet (4 mg total) by mouth every 8 (eight) hours as needed for nausea or vomiting. 11/07/21  Yes Benjiman Core, MD  clindamycin (CLEOCIN) 300 MG capsule Take 1 capsule (300 mg total) by mouth 3 (three) times daily. Patient not taking: Reported on 09/13/2021 05/09/21   Mardella Layman, MD  lidocaine (LIDODERM) 5 % Place 1 patch onto the skin daily. Remove & Discard patch within 12 hours or as directed by MD 10/16/21   Nicanor Alcon, April, MD  naproxen (NAPROSYN) 375 MG tablet Take 1 tablet twice daily as needed for back pain. Patient not taking: Reported on 05/01/2021 11/18/20   Molpus, Jonny Ruiz, MD  naproxen (NAPROSYN) 375 MG tablet Take 1 tablet (375 mg total) by mouth 2 (two) times daily with a meal. 10/16/21   Palumbo, April, MD  oxyCODONE-acetaminophen (PERCOCET/ROXICET) 5-325 MG tablet Take 1 tablet by mouth every 6 (six) hours as needed for severe pain. Patient not taking: Reported on 09/13/2021 05/09/21   Mardella Layman, MD  predniSONE (DELTASONE) 5 MG tablet Take 6 pills for first day, 5 pills second day, 4 pills  third day, 3 pills fourth day, 2 pills the fifth day, and 1 pill sixth day. Patient not taking: Reported on 05/01/2021 11/22/20   Myra Rude, MD  tiZANidine (ZANAFLEX) 2 MG tablet Take 1 tablet (2 mg total) by mouth every 8 (eight) hours as needed for muscle spasms. 10/18/21   Rhys Martini, PA-C  dicyclomine (BENTYL) 20 MG tablet Take 1 tablet (20 mg total) by mouth 2 (two) times daily as needed for spasms. Patient not taking: Reported on 07/28/2020 09/21/17 09/28/20  Charlynne Pander, MD    Allergies    Bee venom, Shellfish allergy, Penicillins, and Betadine [povidone iodine]  Review of Systems   Review of Systems  Constitutional:  Negative for fever.  HENT:  Negative for congestion.   Respiratory:   Negative for shortness of breath.   Gastrointestinal:  Positive for abdominal pain and diarrhea.  Endocrine: Positive for polydipsia. Negative for polyphagia and polyuria.  Genitourinary:  Negative for flank pain.  Musculoskeletal:  Negative for back pain.  Skin:  Negative for rash.  Neurological:  Negative for weakness.  Psychiatric/Behavioral:  Negative for confusion.    Physical Exam Updated Vital Signs BP 106/64 (BP Location: Right Arm)   Pulse 70   Temp 98.2 F (36.8 C) (Oral)   Resp 18   SpO2 97%   Physical Exam Vitals and nursing note reviewed.  HENT:     Head: Atraumatic.  Cardiovascular:     Rate and Rhythm: Normal rate and regular rhythm.  Pulmonary:     Breath sounds: Normal breath sounds.  Abdominal:     Hernia: No hernia is present.  Skin:    General: Skin is warm.     Capillary Refill: Capillary refill takes less than 2 seconds.  Neurological:     Mental Status: He is alert and oriented to person, place, and time.    ED Results / Procedures / Treatments   Labs (all labs ordered are listed, but only abnormal results are displayed) Labs Reviewed  COMPREHENSIVE METABOLIC PANEL - Abnormal; Notable for the following components:      Result Value   Glucose, Bld 103 (*)    Calcium 8.5 (*)    Total Protein 6.2 (*)    AST 42 (*)    ALT 52 (*)    All other components within normal limits  CBC WITH DIFFERENTIAL/PLATELET  LIPASE, BLOOD    EKG None  Radiology No results found.  Procedures Procedures   Medications Ordered in ED Medications  sodium chloride 0.9 % bolus 1,000 mL (0 mLs Intravenous Stopped 11/07/21 1416)  ondansetron (ZOFRAN) injection 4 mg (4 mg Intravenous Given 11/07/21 1257)  iohexol (OMNIPAQUE) 300 MG/ML solution 100 mL (100 mLs Intravenous Contrast Given 11/07/21 1411)    ED Course  I have reviewed the triage vital signs and the nursing notes.  Pertinent labs & imaging results that were available during my care of the patient  were reviewed by me and considered in my medical decision making (see chart for details).    MDM Rules/Calculators/A&P                           Patient with some nausea thirst diarrhea.  Mild abdominal pain.  Benign abdominal exam.  Lab work reassuring.  He has seen ALT just barely above normal.  Sugar only 103.  Not clearly diabetes.  Do not feel as if he needs further work-up this time.  Can follow-up  as an outpatient.  Will discharge home. Final Clinical Impression(s) / ED Diagnoses Final diagnoses:  Excessive thirst  Diarrhea, unspecified type  Generalized abdominal pain    Rx / DC Orders ED Discharge Orders          Ordered    ondansetron (ZOFRAN-ODT) 4 MG disintegrating tablet  Every 8 hours PRN        11/07/21 1508             Benjiman Core, MD 11/15/21 0000

## 2021-12-20 ENCOUNTER — Ambulatory Visit: Payer: Managed Care, Other (non HMO) | Admitting: Critical Care Medicine

## 2023-02-26 ENCOUNTER — Emergency Department (HOSPITAL_COMMUNITY): Payer: 59

## 2023-02-26 ENCOUNTER — Emergency Department (HOSPITAL_COMMUNITY)
Admission: EM | Admit: 2023-02-26 | Discharge: 2023-02-26 | Disposition: A | Discharge status: Home / Self Care | Payer: 59 | Attending: Emergency Medicine | Admitting: Emergency Medicine

## 2023-02-26 DIAGNOSIS — R590 Localized enlarged lymph nodes: Secondary | ICD-10-CM

## 2023-02-26 DIAGNOSIS — J45909 Unspecified asthma, uncomplicated: Secondary | ICD-10-CM | POA: Diagnosis not present

## 2023-02-26 DIAGNOSIS — Z87891 Personal history of nicotine dependence: Secondary | ICD-10-CM | POA: Insufficient documentation

## 2023-02-26 DIAGNOSIS — R1031 Right lower quadrant pain: Secondary | ICD-10-CM | POA: Diagnosis present

## 2023-02-26 DIAGNOSIS — J101 Influenza due to other identified influenza virus with other respiratory manifestations: Secondary | ICD-10-CM | POA: Insufficient documentation

## 2023-02-26 DIAGNOSIS — Z79899 Other long term (current) drug therapy: Secondary | ICD-10-CM | POA: Diagnosis not present

## 2023-02-26 DIAGNOSIS — Z1152 Encounter for screening for COVID-19: Secondary | ICD-10-CM | POA: Insufficient documentation

## 2023-02-26 DIAGNOSIS — J111 Influenza due to unidentified influenza virus with other respiratory manifestations: Secondary | ICD-10-CM

## 2023-02-26 LAB — CBC WITH DIFFERENTIAL/PLATELET
Abs Immature Granulocytes: 0.01 10*3/uL (ref 0.00–0.07)
Basophils Absolute: 0 10*3/uL (ref 0.0–0.1)
Basophils Relative: 1 %
Eosinophils Absolute: 0.3 10*3/uL (ref 0.0–0.5)
Eosinophils Relative: 7 %
HCT: 46.6 % (ref 39.0–52.0)
Hemoglobin: 16.1 g/dL (ref 13.0–17.0)
Immature Granulocytes: 0 %
Lymphocytes Relative: 17 %
Lymphs Abs: 0.9 10*3/uL (ref 0.7–4.0)
MCH: 30 pg (ref 26.0–34.0)
MCHC: 34.5 g/dL (ref 30.0–36.0)
MCV: 86.9 fL (ref 80.0–100.0)
Monocytes Absolute: 0.5 10*3/uL (ref 0.1–1.0)
Monocytes Relative: 10 %
Neutro Abs: 3.4 10*3/uL (ref 1.7–7.7)
Neutrophils Relative %: 65 %
Platelets: 186 10*3/uL (ref 150–400)
RBC: 5.36 MIL/uL (ref 4.22–5.81)
RDW: 12.7 % (ref 11.5–15.5)
WBC: 5.1 10*3/uL (ref 4.0–10.5)
nRBC: 0 % (ref 0.0–0.2)

## 2023-02-26 LAB — URINALYSIS, ROUTINE W REFLEX MICROSCOPIC
Bilirubin Urine: NEGATIVE
Glucose, UA: NEGATIVE mg/dL
Hgb urine dipstick: NEGATIVE
Ketones, ur: 20 mg/dL — AB
Leukocytes,Ua: NEGATIVE
Nitrite: NEGATIVE
Protein, ur: NEGATIVE mg/dL
Specific Gravity, Urine: 1.015 (ref 1.005–1.030)
pH: 5 (ref 5.0–8.0)

## 2023-02-26 LAB — COMPREHENSIVE METABOLIC PANEL
ALT: 39 U/L (ref 0–44)
AST: 42 U/L — ABNORMAL HIGH (ref 15–41)
Albumin: 4.2 g/dL (ref 3.5–5.0)
Alkaline Phosphatase: 68 U/L (ref 38–126)
Anion gap: 11 (ref 5–15)
BUN: 13 mg/dL (ref 6–20)
CO2: 21 mmol/L — ABNORMAL LOW (ref 22–32)
Calcium: 8.6 mg/dL — ABNORMAL LOW (ref 8.9–10.3)
Chloride: 102 mmol/L (ref 98–111)
Creatinine, Ser: 0.95 mg/dL (ref 0.61–1.24)
GFR, Estimated: >60 mL/min (ref >60)
Glucose, Bld: 99 mg/dL (ref 70–99)
Potassium: 3.9 mmol/L (ref 3.5–5.1)
Sodium: 134 mmol/L — ABNORMAL LOW (ref 135–145)
Total Bilirubin: 1.1 mg/dL (ref 0.3–1.2)
Total Protein: 8.4 g/dL — ABNORMAL HIGH (ref 6.5–8.1)

## 2023-02-26 LAB — RESP PANEL BY RT-PCR (RSV, FLU A&B, COVID)  RVPGX2
Influenza A by PCR: POSITIVE — AB
Influenza B by PCR: NEGATIVE
Resp Syncytial Virus by PCR: NEGATIVE
SARS Coronavirus 2 by RT PCR: NEGATIVE

## 2023-02-26 LAB — I-STAT CHEM 8, ED
BUN: 11 mg/dL (ref 6–20)
Calcium, Ion: 1.1 mmol/L — ABNORMAL LOW (ref 1.15–1.40)
Chloride: 104 mmol/L (ref 98–111)
Creatinine, Ser: 1 mg/dL (ref 0.61–1.24)
Glucose, Bld: 98 mg/dL (ref 70–99)
HCT: 49 % (ref 39.0–52.0)
Hemoglobin: 16.7 g/dL (ref 13.0–17.0)
Potassium: 4 mmol/L (ref 3.5–5.1)
Sodium: 138 mmol/L (ref 135–145)
TCO2: 22 mmol/L (ref 22–32)

## 2023-02-26 LAB — LIPASE, BLOOD: Lipase: 29 U/L (ref 11–51)

## 2023-02-26 MED ORDER — ONDANSETRON HCL 4 MG/2ML IJ SOLN
4.0000 mg | Freq: Once | INTRAMUSCULAR | Status: AC
  Administered 2023-02-26: 4 mg via INTRAVENOUS
  Filled 2023-02-26: qty 2

## 2023-02-26 MED ORDER — SODIUM CHLORIDE 0.9 % IV BOLUS
1000.0000 mL | Freq: Once | INTRAVENOUS | Status: AC
  Administered 2023-02-26: 1000 mL via INTRAVENOUS

## 2023-02-26 MED ORDER — MORPHINE SULFATE (PF) 4 MG/ML IV SOLN
4.0000 mg | Freq: Once | INTRAVENOUS | Status: AC
  Administered 2023-02-26: 4 mg via INTRAVENOUS
  Filled 2023-02-26: qty 1

## 2023-02-26 MED ORDER — IOHEXOL 300 MG/ML  SOLN
100.0000 mL | Freq: Once | INTRAMUSCULAR | Status: AC | PRN
  Administered 2023-02-26: 100 mL via INTRAVENOUS

## 2023-02-26 MED ORDER — DOXYCYCLINE HYCLATE 100 MG PO CAPS: 100.0000 mg | ORAL_CAPSULE | Freq: Two times a day (BID) | ORAL | 0 refills | Status: AC

## 2023-02-26 MED ORDER — SODIUM CHLORIDE 0.9 % IV BOLUS: 500.0000 mL | Freq: Once | INTRAVENOUS | Status: DC

## 2023-02-26 NOTE — ED Notes (Signed)
Pt states he can not urinate due to not enough fluid intake over several days

## 2023-02-26 NOTE — Discharge Instructions (Addendum)
We evaluated you for your your cough and abdominal pain.  Your influenza test was positive.  We did not see any dangerous causes of abdominal pain on your CT scan.  Your urinalysis did not show any signs of a urinary infection.  Please take Tylenol and Motrin for your symptoms at home.  You can take 1000 mg of Tylenol every 6 hours and 600 mg of ibuprofen every 6 hours as needed for your symptoms.  You can take these medicines together as needed, either at the same time, or alternating every 3 hours.  Please be sure to drink lots of fluids.  We noticed on your chest x-ray that you had enlarged lymph nodes in your chest.  We performed a CT scan which also shows enlarged lymph nodes.  This can be a sign of a type of cancer called lymphoma.  We have placed a referral for oncology to help obtain further evaluation of your enlarged lymph nodes.  It is very important to follow-up.  Please call them if you do not hear from them in the next 2 to 3 days.  Please return if you develop any new symptoms such as uncontrolled pain, vomiting, bloody vomit, blood in your stool, fainting or severe lightheadedness, severe chest pain, or any other new symptoms.

## 2023-02-26 NOTE — ED Triage Notes (Signed)
Pt states that he was diagnosed with the flu on Sunday and has since been having abdominal pain and back pain. Pt endorses diarrhea.

## 2023-02-26 NOTE — ED Notes (Addendum)
Pt attempting to sleep but cooperative Original note wrong pt

## 2023-02-26 NOTE — ED Provider Notes (Signed)
[facility] Provider Note  CSN: [id number] Arrival date & time: 02/26/23 0707  Chief Complaint(s) Influenza and Abdominal Pain  HPI Donald Fisher is a 32 y.o. male history of asthma, GERD presenting to the emergency department with abdominal pain.  Reports severe abdominal pain for the past few days.  Worse in the lower abdomen.  He also reports sore throat, runny nose, cough, diarrhea, was diagnosed with the flu this weekend.  He reports the pain is severe.  He reports some nausea and decreased appetite.  He also reports chest pain with cough.  No bloody stools.   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 Authorizing Provider  doxycycline (VIBRAMYCIN) 100 MG capsule Take 1 capsule (100 mg total) by mouth 2 (two) times daily for 7 days. 02/26/23 03/05/23 Yes Cristie Hem, MD  clindamycin (CLEOCIN) 300 MG capsule Take 1 capsule (300 mg total) by mouth 3 (three) times daily. Patient not taking: Reported on 09/13/2021 05/09/21   Vanessa Kick, MD  lidocaine (LIDODERM) 5 % Place 1 patch onto the skin daily. Remove & Discard patch within 12 hours or as directed by MD 10/16/21   Randal Buba, April, MD  naproxen (NAPROSYN) 375 MG tablet Take 1 tablet twice daily as needed for back pain. Patient not taking: Reported on 05/01/2021 11/18/20   Molpus, Jenny Reichmann, MD  naproxen (NAPROSYN) 375 MG tablet Take 1 tablet (375 mg total) by mouth 2 (two) times daily with a meal. 10/16/21   Palumbo, April, MD  ondansetron (ZOFRAN-ODT) 4 MG  disintegrating tablet Take 1 tablet (4 mg total) by mouth every 8 (eight) hours as needed for nausea or vomiting. 11/07/21   Davonna Belling, MD  oxyCODONE-acetaminophen (PERCOCET/ROXICET) 5-325 MG tablet Take 1 tablet by mouth every 6 (six) hours as needed for severe pain. Patient not taking: Reported on 09/13/2021 05/09/21   Vanessa Kick, MD  predniSONE (DELTASONE) 5 MG tablet Take 6 pills for first day, 5 pills second day, 4 pills third day, 3 pills fourth day, 2 pills the fifth day, and 1 pill sixth day. Patient not taking: Reported on 05/01/2021 11/22/20   Rosemarie Ax, MD  tiZANidine (ZANAFLEX) 2 MG tablet Take 1 tablet (2 mg total) by mouth every 8 (eight) hours as needed for muscle spasms. 10/18/21   Hazel Sams, PA-C  dicyclomine (BENTYL) 20 MG tablet Take 1 tablet (20 mg total) by mouth 2 (two) times daily as needed for spasms. Patient not taking: Reported on 07/28/2020 09/21/17 09/28/20  Drenda Freeze, MD  Past Surgical History Past Surgical History:  Procedure Laterality Date   SHOULDER ARTHROSCOPY WITH LABRAL REPAIR Right 07/21/2016   Procedure: RIGHT SHOULDER ARTHROSCOPY WITH LABRAL REPAIR;  Surgeon: Mcarthur Rossetti, MD;  Location: [facility];  Service: Orthopedics;  Laterality: Right;  Shoulder Block   Family History Family History  Problem Relation Age of Onset   Hypertension Mother    Congestive Heart Failure Maternal Grandmother    Hypertension Maternal Grandmother     Social History Social History   Tobacco Use   Smoking status: Former    Packs/day: 0.50    Years: 10.00    Additional pack years: 0.00    Total pack years: 5.00    Types: Cigarettes   Smokeless tobacco: Never  Vaping Use   Vaping Use: Never used  Substance Use Topics   Alcohol use: Yes    Comment: occasionally   Drug use: Yes    Types: Marijuana    Allergies Bee venom, Shellfish allergy, Penicillins, and Betadine [povidone iodine]  Review of Systems Review of Systems  All other systems reviewed and are negative.   Physical Exam Vital Signs  I have reviewed the triage vital signs BP 130/84 (BP Location: Right Arm)   Pulse 96   Temp 98 F (36.7 C) (Oral)   Resp 17   SpO2 98%  Physical Exam Vitals and nursing note reviewed.  Constitutional:      General: He is in acute distress.     Appearance: Normal appearance.  HENT:     Mouth/Throat:     Mouth: Mucous membranes are moist.  Eyes:     Conjunctiva/sclera: Conjunctivae normal.  Cardiovascular:     Rate and Rhythm: Normal rate and regular rhythm.  Pulmonary:     Effort: Pulmonary effort is normal. No respiratory distress.     Breath sounds: Normal breath sounds.  Abdominal:     General: Abdomen is flat.     Palpations: Abdomen is soft.     Tenderness: There is abdominal tenderness (tearful when abdomen palpated) in the right lower quadrant, suprapubic area and left lower quadrant.  Musculoskeletal:     Right lower leg: No edema.     Left lower leg: No edema.  Skin:    General: Skin is warm and dry.     Capillary Refill: Capillary refill takes less than 2 seconds.  Neurological:     Mental Status: He is alert and oriented to person, place, and time. Mental status is at baseline.  Psychiatric:        Mood and Affect: Mood normal.        Behavior: Behavior normal.     ED Results and Treatments Labs (all labs ordered are listed, but only abnormal results are displayed) Labs Reviewed  RESP PANEL BY RT-PCR (RSV, FLU A&B, COVID)  RVPGX2 - Abnormal; Notable for the following components:      Result Value   Influenza A by PCR POSITIVE (*)    All other components within normal limits  COMPREHENSIVE METABOLIC PANEL - Abnormal; Notable for the following components:   Sodium 134 (*)    CO2 21 (*)    Calcium 8.6 (*)    Total Protein 8.4 (*)    AST 42 (*)    All  other components within normal limits  URINALYSIS, ROUTINE W REFLEX MICROSCOPIC - Abnormal; Notable for the following components:   Ketones, ur 20 (*)    All other components within normal limits  I-STAT CHEM 8, ED -  Abnormal; Notable for the following components:   Calcium, Ion 1.10 (*)    All other components within normal limits  CBC WITH DIFFERENTIAL/PLATELET  LIPASE, BLOOD                                                                                                                          Radiology No results found.  Pertinent labs & imaging results that were available during my care of the patient were reviewed by me and considered in my medical decision making (see MDM for details).  Medications Ordered in ED Medications  sodium chloride 0.9 % bolus 1,000 mL (0 mLs Intravenous Stopped 02/26/23 1457)  ondansetron (ZOFRAN) injection 4 mg (4 mg Intravenous Given 02/26/23 0902)  morphine (PF) 4 MG/ML injection 4 mg (4 mg Intravenous Given 02/26/23 0903)  iohexol (OMNIPAQUE) 300 MG/ML solution 100 mL (100 mLs Intravenous Contrast Given 02/26/23 0939)                                                                                                                                     Procedures Procedures  (including critical care time)  Medical Decision Making / ED Course   MDM:  32 year old male presenting to the emergency department with abdominal pain.  Suspect most likely cause of abdominal pain is influenza related, reports pain with coughing which would suggest muscle strain from coughing versus due to diarrhea from influenza.  However patient seems extremely uncomfortable, which would not necessarily go along with either of these because of his degree of pain, and is even crying when palpated his abdomen.  Given this, although lower concern for acute intra-abdominal process such as appendicitis, diverticulitis, obstruction, will obtain CT scan.  Patient denies dysuria, will  check urinalysis.  Clinical Course as of 02/27/23 1443  Mon Feb 26, 2023  1144 Chest x-ray showed hilar adenopathy, CT chest was performed showing possible lymphoma or other acute process.  Will place expedited referral to oncology.  Patient has no B symptoms.  Denies ever hearing this before.  His labs are reassuring without leukocytosis or any blasts.  CT abdomen pelvis without evidence of acute pathology.  Patient reports he has not been able to urinate since last night, CT scan shows a full bladder.  Encourage patient to try to urinate again, if unable to urinate, likely place Foley catheter, symptoms may actually just be due to bladder  obstruction although given age this would be an unexpected diagnosis. [WS]  [id number] Patient able to urinate, urinalysis shows no evidence of infection.  CT chest also did show some infiltrate which radiology reported seem to be an inflammatory process given cough will cover for pneumonia with antibiotics in case this is actually a pneumonia.  Placed expedited referral to oncology for further workup of mediastinal adenopathy as well as upper abdominal adenopathy.  Patient understands reasons to return. Will discharge patient to home. All questions answered. Patient comfortable with plan of discharge. Return precautions discussed with patient and specified on the after visit summary.  [WS]    Clinical Course User Index [WS] Cristie Hem, MD     Additional history obtained: -External records from outside source obtained and reviewed including: Chart review including previous notes, labs, imaging, consultation notes including ED visit 11/07/21   Lab Tests: -I ordered, reviewed, and interpreted labs.   The pertinent results include:   Labs Reviewed  RESP PANEL BY RT-PCR (RSV, FLU A&B, COVID)  RVPGX2 - Abnormal; Notable for the following components:      Result Value   Influenza A by PCR POSITIVE (*)    All other components within normal limits   COMPREHENSIVE METABOLIC PANEL - Abnormal; Notable for the following components:   Sodium 134 (*)    CO2 21 (*)    Calcium 8.6 (*)    Total Protein 8.4 (*)    AST 42 (*)    All other components within normal limits  URINALYSIS, ROUTINE W REFLEX MICROSCOPIC - Abnormal; Notable for the following components:   Ketones, ur 20 (*)    All other components within normal limits  I-STAT CHEM 8, ED - Abnormal; Notable for the following components:   Calcium, Ion 1.10 (*)    All other components within normal limits  CBC WITH DIFFERENTIAL/PLATELET  LIPASE, BLOOD    Notable for positive flu, UA w/o signs of infection  EKG   EKG Interpretation  Date/Time:  Monday February 26 2023 12:23:26 EDT Ventricular Rate:  89 PR Interval:  153 QRS Duration: 96 QT Interval:  348 QTC Calculation: 424 R Axis:   16 Text Interpretation: Sinus rhythm Atrial premature complex Probable left ventricular hypertrophy Borderline T abnormalities, inferior leads No significant change since last tracing Confirmed by Isla Pence [phone]) on 02/27/2023 12:22:25 PM         Imaging Studies ordered: I ordered imaging studies including CT chest, abdomen On my interpretation imaging demonstrates mediastinal adenopathy of unclear cause I independently visualized and interpreted imaging. I agree with the radiologist interpretation   Medicines ordered and prescription drug management: Meds ordered this encounter  Medications   sodium chloride 0.9 % bolus 1,000 mL   ondansetron (ZOFRAN) injection 4 mg   morphine (PF) 4 MG/ML injection 4 mg   iohexol (OMNIPAQUE) 300 MG/ML solution 100 mL   DISCONTD: sodium chloride 0.9 % bolus 500 mL   doxycycline (VIBRAMYCIN) 100 MG capsule    Sig: Take 1 capsule (100 mg total) by mouth 2 (two) times daily for 7 days.    Dispense:  14 capsule    Refill:  0    -I have reviewed the patients home medicines and have made adjustments as needed Cardiac Monitoring: The patient was  maintained on a cardiac monitor.  I personally viewed and interpreted the cardiac monitored which showed an underlying rhythm of: NSR  Social Determinants of Health:  Diagnosis or treatment significantly limited by social determinants of  health: obesity   Reevaluation: After the interventions noted above, I reevaluated the patient and found that their symptoms have improved  Co morbidities that complicate the patient evaluation  Past Medical History:  Diagnosis Date   Asthma    hx of    Bronchitis    GERD (gastroesophageal reflux disease)    Heart murmur    hx of    Pneumonia    hx of       Dispostion: Disposition decision including need for hospitalization was considered, and patient discharged from emergency department.    Final Clinical Impression(s) / ED Diagnoses Final diagnoses:  Influenza  Mediastinal adenopathy     This chart was dictated using voice recognition software.  Despite best efforts to proofread,  errors can occur which can change the documentation meaning.    Cristie Hem, MD 02/27/23 [phone]

## 2023-03-19 ENCOUNTER — Encounter: Payer: Self-pay | Admitting: *Deleted

## 2023-07-06 ENCOUNTER — Other Ambulatory Visit: Payer: Self-pay | Admitting: Specialist

## 2023-07-06 ENCOUNTER — Ambulatory Visit: Admission: RE | Admit: 2023-07-06 | Payer: 59 | Source: Ambulatory Visit

## 2023-07-06 DIAGNOSIS — D869 Sarcoidosis, unspecified: Secondary | ICD-10-CM

## 2023-09-15 ENCOUNTER — Other Ambulatory Visit: Payer: Self-pay

## 2023-09-15 ENCOUNTER — Emergency Department (HOSPITAL_COMMUNITY)
Admission: EM | Admit: 2023-09-15 | Discharge: 2023-09-15 | Disposition: A | Discharge status: Home / Self Care | Payer: 59 | Attending: Emergency Medicine | Admitting: Emergency Medicine

## 2023-09-15 ENCOUNTER — Encounter (HOSPITAL_COMMUNITY): Payer: Self-pay

## 2023-09-15 DIAGNOSIS — T22211A Burn of second degree of right forearm, initial encounter: Secondary | ICD-10-CM | POA: Insufficient documentation

## 2023-09-15 DIAGNOSIS — T22011A Burn of unspecified degree of right forearm, initial encounter: Secondary | ICD-10-CM | POA: Diagnosis present

## 2023-09-15 DIAGNOSIS — T310 Burns involving less than 10% of body surface: Secondary | ICD-10-CM | POA: Diagnosis not present

## 2023-09-15 DIAGNOSIS — Z23 Encounter for immunization: Secondary | ICD-10-CM | POA: Insufficient documentation

## 2023-09-15 DIAGNOSIS — X17XXXA Contact with hot engines, machinery and tools, initial encounter: Secondary | ICD-10-CM | POA: Diagnosis not present

## 2023-09-15 MED ORDER — HYDROCODONE-ACETAMINOPHEN 5-325 MG PO TABS: 1.0000 | ORAL_TABLET | Freq: Four times a day (QID) | ORAL | 0 refills | Status: DC | PRN

## 2023-09-15 MED ORDER — HYDROMORPHONE HCL 1 MG/ML IJ SOLN
1.0000 mg | Freq: Once | INTRAMUSCULAR | Status: AC
  Administered 2023-09-15: 1 mg via INTRAVENOUS
  Filled 2023-09-15: qty 1

## 2023-09-15 MED ORDER — IBUPROFEN 600 MG PO TABS: 600.0000 mg | ORAL_TABLET | Freq: Four times a day (QID) | ORAL | 0 refills | Status: DC | PRN

## 2023-09-15 MED ORDER — IBUPROFEN 600 MG PO TABS: 600.0000 mg | ORAL_TABLET | Freq: Four times a day (QID) | ORAL | 0 refills | Status: AC | PRN

## 2023-09-15 MED ORDER — HYDROCODONE-ACETAMINOPHEN 5-325 MG PO TABS: 1.0000 | ORAL_TABLET | Freq: Four times a day (QID) | ORAL | 0 refills | Status: AC | PRN

## 2023-09-15 MED ORDER — TETANUS-DIPHTH-ACELL PERTUSSIS 5-2.5-18.5 LF-MCG/0.5 IM SUSY
0.5000 mL | PREFILLED_SYRINGE | Freq: Once | INTRAMUSCULAR | Status: AC
  Administered 2023-09-15: 0.5 mL via INTRAMUSCULAR
  Filled 2023-09-15: qty 0.5

## 2023-09-15 MED ORDER — KETOROLAC TROMETHAMINE 30 MG/ML IJ SOLN
15.0000 mg | Freq: Once | INTRAMUSCULAR | Status: AC
  Administered 2023-09-15: 15 mg via INTRAVENOUS
  Filled 2023-09-15: qty 1
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
## 2023-09-15 NOTE — Discharge Instructions (Addendum)
Follow up with your doctor for recheck in 2 days. If you do not have a primary care provider return to the ED for this recheck.   Clean the second degree burn everyday with warm soapy water. Apply topical antibiotics like Neosporin or Bacitracin and a nonstick bandage. These supplies can be found in any drug store.   Take Norco for pain as needed, and ibuprofen regularly for pain and inflammation.

## 2024-04-04 ENCOUNTER — Emergency Department (HOSPITAL_COMMUNITY)
Admission: EM | Admit: 2024-04-04 | Discharge: 2024-04-04 | Disposition: A | Discharge status: Home / Self Care | Attending: Emergency Medicine | Admitting: Emergency Medicine

## 2024-04-04 ENCOUNTER — Other Ambulatory Visit: Payer: Self-pay

## 2024-04-04 DIAGNOSIS — Y9301 Activity, walking, marching and hiking: Secondary | ICD-10-CM | POA: Insufficient documentation

## 2024-04-04 DIAGNOSIS — S0181XA Laceration without foreign body of other part of head, initial encounter: Secondary | ICD-10-CM | POA: Diagnosis not present

## 2024-04-04 DIAGNOSIS — S0990XA Unspecified injury of head, initial encounter: Secondary | ICD-10-CM | POA: Diagnosis present

## 2024-04-04 DIAGNOSIS — Z87891 Personal history of nicotine dependence: Secondary | ICD-10-CM | POA: Insufficient documentation

## 2024-04-04 DIAGNOSIS — W109XXA Fall (on) (from) unspecified stairs and steps, initial encounter: Secondary | ICD-10-CM | POA: Diagnosis not present

## 2024-04-04 DIAGNOSIS — J45909 Unspecified asthma, uncomplicated: Secondary | ICD-10-CM | POA: Insufficient documentation

## 2024-04-04 NOTE — Discharge Instructions (Signed)
 Please take Tylenol  (acetaminophen ) and Motrin  (ibuprofen ) for your symptoms at home.  You can take 1000 mg of Tylenol  every 6 hours and 600 mg of Motrin  every 6 hours as needed for your symptoms.  You can take these medicines together as needed, either at the same time, or alternating every 3 hours.  Please return for any severe uncontrolled headaches, vomiting, vision changes, confusion, or any other concerning symptoms.

## 2024-04-04 NOTE — ED Triage Notes (Signed)
 Pt states that he was quickly walking up some steps when he tripped and fell. Pt hit his head causing a laceration. Pt is unsure if he had LOC, but pt was able to get up and drive himself to the emergency department. Pt states he is feeling slightly dizzy, but denies nausea or vomiting.

## 2024-04-04 NOTE — ED Provider Notes (Signed)
 [place] EMERGENCY DEPARTMENT AT [facility] Provider Note  CSN: [id number] Arrival date & time: 04/04/24 1618  Chief Complaint(s) Head Laceration  HPI Donald Fisher is a 33 y.o. male with no relevant past medical history presenting to the emergency department with head injury.  He reports that he was walking up some steps, tripped and hit his forehead on the step or railing.  Developed some bleeding.  Denies any loss of consciousness.  Denies any headaches.  No nausea, vomiting.  Denies any blood thinner use.  No neck pain.  No other injuries.   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 Authorizing Provider  clindamycin  (CLEOCIN ) 300 MG capsule Take 1 capsule (300 mg total) by mouth 3 (three) times daily. Patient not taking: Reported on 09/13/2021 05/09/21   Afton Albright, MD  HYDROcodone -acetaminophen  (NORCO) 5-325 MG tablet Take 1-2 tablets by mouth every 6 (six) hours as needed for moderate pain. 09/15/23   Mandy Second, PA-C  ibuprofen  (ADVIL ) 600 MG tablet Take 1 tablet (600 mg total) by mouth every 6 (six) hours as needed. 09/15/23   Mandy Second, PA-C  lidocaine  (LIDODERM ) 5 % Place 1 patch onto the skin daily. Remove & Discard patch within 12 hours or as directed by MD 10/16/21   Maralee Senate, April, MD  naproxen  (NAPROSYN ) 375 MG tablet Take 1 tablet twice daily as needed for back pain. Patient not taking: Reported on 05/01/2021 11/18/20   Molpus, Autry Legions, MD  naproxen  (NAPROSYN ) 375 MG tablet Take 1 tablet (375 mg total) by mouth 2 (two)  times daily with a meal. 10/16/21   Palumbo, April, MD  ondansetron  (ZOFRAN -ODT) 4 MG disintegrating tablet Take 1 tablet (4 mg total) by mouth every 8 (eight) hours as needed for nausea or vomiting. 11/07/21   Mozell Arias, MD  oxyCODONE -acetaminophen  (PERCOCET/ROXICET) 5-325 MG tablet Take 1 tablet by mouth every 6 (six) hours as needed for severe pain. Patient not taking: Reported on 09/13/2021 05/09/21   Afton Albright, MD  predniSONE  (DELTASONE ) 5 MG tablet Take 6 pills for first day, 5 pills second day, 4 pills third day, 3 pills fourth day, 2 pills the fifth day, and 1 pill sixth day. Patient not taking: Reported on 05/01/2021 11/22/20   Margaree Shark, MD  tiZANidine  (ZANAFLEX ) 2 MG tablet Take 1 tablet (2 mg total) by mouth every 8 (eight) hours as needed for muscle spasms. 10/18/21   Graham, Laura E, PA-C  dicyclomine  (BENTYL ) 20 MG tablet Take 1 tablet (20 mg total) by mouth 2 (two) times daily as needed for spasms. Patient not taking: Reported on 07/28/2020 09/21/17 09/28/20  Dalene Duck, MD  Past Surgical History Past Surgical History:  Procedure Laterality Date   SHOULDER ARTHROSCOPY WITH LABRAL REPAIR Right 07/21/2016   Procedure: RIGHT SHOULDER ARTHROSCOPY WITH LABRAL REPAIR;  Surgeon: Arnie Lao, MD;  Location: [facility];  Service: Orthopedics;  Laterality: Right;  Shoulder Block   Family History Family History  Problem Relation Age of Onset   Hypertension Mother    Congestive Heart Failure Maternal Grandmother    Hypertension Maternal Grandmother     Social History Social History   Tobacco Use   Smoking status: Former    Current packs/day: 0.50    Average packs/day: 0.5 packs/day for 10.0 years (5.0 ttl pk-yrs)    Types: Cigarettes   Smokeless tobacco: Never  Vaping Use   Vaping status: Never Used  Substance Use Topics    Alcohol use: Yes    Comment: occasionally   Drug use: Yes    Types: Marijuana   Allergies Bee venom, Shellfish allergy, Penicillins, and Betadine [povidone iodine]  Review of Systems Review of Systems  All other systems reviewed and are negative.   Physical Exam Vital Signs  I have reviewed the triage vital signs BP (!) 144/94 (BP Location: Right Arm)   Pulse (!) 101   Temp 98.8 F (37.1 C) (Oral)   Resp 15   Ht 5\' 7"  (1.702 m)   Wt 104.3 kg   SpO2 97%   BMI 36.02 kg/m  Physical Exam Vitals and nursing note reviewed.  Constitutional:      General: He is not in acute distress.    Appearance: Normal appearance.  HENT:     Head: Normocephalic.     Comments: Approximately 1 cm laceration over the forehead    Mouth/Throat:     Mouth: Mucous membranes are moist.  Eyes:     Conjunctiva/sclera: Conjunctivae normal.  Neck:     Comments: No midline C spine tenderness Cardiovascular:     Rate and Rhythm: Normal rate and regular rhythm.  Pulmonary:     Effort: Pulmonary effort is normal. No respiratory distress.     Breath sounds: Normal breath sounds.  Abdominal:     General: Abdomen is flat.     Palpations: Abdomen is soft.     Tenderness: There is no abdominal tenderness.  Musculoskeletal:     Right lower leg: No edema.     Left lower leg: No edema.  Skin:    General: Skin is warm and dry.     Capillary Refill: Capillary refill takes less than 2 seconds.  Neurological:     Mental Status: He is alert and oriented to person, place, and time. Mental status is at baseline.     Comments: Cranial nerves II through XII intact, moves all 4 extremities.  Psychiatric:        Mood and Affect: Mood normal.        Behavior: Behavior normal.     ED Results and Treatments Labs (all labs ordered are listed, but only abnormal results are displayed) Labs Reviewed - No data to display  Radiology No results found.  Pertinent labs & imaging results that were available during my care of the patient were reviewed by me and considered in my medical decision making (see MDM for details).  Medications Ordered in ED Medications - No data to display                                                                                                                                   Procedures .Laceration Repair  Date/Time: 04/04/2024 5:33 PM  Performed by: Mordecai Applebaum, MD Authorized by: Mordecai Applebaum, MD   Consent:    Consent obtained:  Verbal   Consent given by:  Patient   Risks, benefits, and alternatives were discussed: yes     Risks discussed:  Infection, need for additional repair, nerve damage, pain, vascular damage, poor wound healing, poor cosmetic result, retained foreign body and tendon damage   Alternatives discussed:  No treatment Universal protocol:    Patient identity confirmed:  Verbally with patient and arm band Laceration details:    Location:  Face   Face location:  Forehead   Length (cm):  1 Exploration:    Wound exploration: wound explored through full range of motion and entire depth of wound visualized     Wound extent: areolar tissue not violated, fascia not violated, no foreign body, no signs of injury, no nerve damage, no tendon damage, no underlying fracture and no vascular damage     Contaminated: no   Treatment:    Area cleansed with:  Saline   Amount of cleaning:  Standard   Irrigation solution:  Sterile saline   Irrigation method:  Syringe   Visualized foreign bodies/material removed: no     Debridement:  None   Undermining:  None   Scar revision: no   Skin repair:    Repair method:  Tissue adhesive Approximation:    Approximation:  Close Repair type:    Repair type:  Simple Post-procedure details:    Procedure completion:  Tolerated well, no immediate complications   (including critical care  time)  Medical Decision Making / ED Course   MDM:  33 year old presenting to the emergency department with 1 cm laceration of the forehead.  Wound is without signs of contamination or foreign body.  Wound thoroughly irrigated.  Wound is not under any tension so elected to repair with Dermabond.  See procedure note for details.  Given head injury considered other dangerous process such as intracranial bleeding or skull fracture but examination reassuring and patient denies loss of consciousness, blood thinner use.  Will defer head CT.  Given strict ER precautions for any worsening symptoms at home. Will discharge patient to home. All questions answered. Patient comfortable with plan of discharge. Return precautions discussed with patient and specified on the after visit summary.       Additional history obtained:  -External records from outside source obtained and reviewed including: Chart  review including previous notes, labs, imaging, consultation notes including prior tdap    Medicines ordered and prescription drug management: No orders of the defined types were placed in this encounter.   -I have reviewed the patients home medicines and have made adjustments as needed   Reevaluation: After the interventions noted above, I reevaluated the patient and found that their symptoms have improved  Co morbidities that complicate the patient evaluation  Past Medical History:  Diagnosis Date   Asthma    hx of    Bronchitis    GERD (gastroesophageal reflux disease)    Heart murmur    hx of    Pneumonia    hx of       Dispostion: Disposition decision including need for hospitalization was considered, and patient discharged from emergency department.    Final Clinical Impression(s) / ED Diagnoses Final diagnoses:  Laceration of forehead, initial encounter  Minor head injury, initial encounter     This chart was dictated using voice recognition software.  Despite best  efforts to proofread,  errors can occur which can change the documentation meaning.    Mordecai Applebaum, MD 04/04/24 [phone]

## 2024-06-04 ENCOUNTER — Emergency Department (HOSPITAL_COMMUNITY)
Admission: EM | Admit: 2024-06-04 | Discharge: 2024-06-04 | Disposition: A | Discharge status: Home / Self Care | Payer: Worker's Compensation | Attending: Student | Admitting: Student

## 2024-06-04 ENCOUNTER — Other Ambulatory Visit: Payer: Self-pay

## 2024-06-04 ENCOUNTER — Emergency Department (HOSPITAL_COMMUNITY): Payer: Worker's Compensation

## 2024-06-04 ENCOUNTER — Encounter (HOSPITAL_COMMUNITY): Payer: Self-pay | Admitting: *Deleted

## 2024-06-04 DIAGNOSIS — I10 Essential (primary) hypertension: Secondary | ICD-10-CM | POA: Insufficient documentation

## 2024-06-04 DIAGNOSIS — M25562 Pain in left knee: Secondary | ICD-10-CM | POA: Insufficient documentation

## 2024-06-04 DIAGNOSIS — Z79899 Other long term (current) drug therapy: Secondary | ICD-10-CM | POA: Insufficient documentation

## 2024-06-04 DIAGNOSIS — Y92410 Unspecified street and highway as the place of occurrence of the external cause: Secondary | ICD-10-CM | POA: Insufficient documentation

## 2024-06-04 DIAGNOSIS — M545 Low back pain, unspecified: Secondary | ICD-10-CM | POA: Insufficient documentation

## 2024-06-04 DIAGNOSIS — Z87891 Personal history of nicotine dependence: Secondary | ICD-10-CM | POA: Insufficient documentation

## 2024-06-04 DIAGNOSIS — M25561 Pain in right knee: Secondary | ICD-10-CM | POA: Insufficient documentation

## 2024-06-04 DIAGNOSIS — J45909 Unspecified asthma, uncomplicated: Secondary | ICD-10-CM | POA: Diagnosis not present

## 2024-06-04 MED ORDER — LIDOCAINE 5 % EX PTCH: 1.0000 | MEDICATED_PATCH | CUTANEOUS | 0 refills | Status: AC

## 2024-06-04 MED ORDER — NAPROXEN 375 MG PO TABS: 375.0000 mg | ORAL_TABLET | Freq: Two times a day (BID) | ORAL | 0 refills | Status: DC

## 2024-06-04 MED ORDER — NAPROXEN 500 MG PO TABS
500.0000 mg | ORAL_TABLET | Freq: Once | ORAL | Status: AC
  Administered 2024-06-04: 500 mg via ORAL
  Filled 2024-06-04: qty 1
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
## 2024-10-01 ENCOUNTER — Encounter (HOSPITAL_BASED_OUTPATIENT_CLINIC_OR_DEPARTMENT_OTHER): Payer: Self-pay | Admitting: Emergency Medicine

## 2024-10-01 ENCOUNTER — Other Ambulatory Visit: Payer: Self-pay

## 2024-10-01 ENCOUNTER — Emergency Department (HOSPITAL_BASED_OUTPATIENT_CLINIC_OR_DEPARTMENT_OTHER)
Admission: EM | Admit: 2024-10-01 | Discharge: 2024-10-01 | Disposition: A | Discharge status: Home / Self Care | Attending: Emergency Medicine | Admitting: Emergency Medicine

## 2024-10-01 ENCOUNTER — Emergency Department (HOSPITAL_BASED_OUTPATIENT_CLINIC_OR_DEPARTMENT_OTHER): Admitting: Radiology

## 2024-10-01 DIAGNOSIS — M545 Low back pain, unspecified: Secondary | ICD-10-CM | POA: Diagnosis present

## 2024-10-01 DIAGNOSIS — M5441 Lumbago with sciatica, right side: Secondary | ICD-10-CM | POA: Insufficient documentation

## 2024-10-01 MED ORDER — METHOCARBAMOL 500 MG PO TABS
750.0000 mg | ORAL_TABLET | Freq: Once | ORAL | Status: AC
  Administered 2024-10-01: 750 mg via ORAL
  Filled 2024-10-01: qty 2

## 2024-10-01 MED ORDER — KETOROLAC TROMETHAMINE 15 MG/ML IJ SOLN
15.0000 mg | Freq: Once | INTRAMUSCULAR | Status: AC
  Administered 2024-10-01: 15 mg via INTRAMUSCULAR
  Filled 2024-10-01: qty 1

## 2024-10-01 MED ORDER — NAPROXEN 375 MG PO TABS: 375.0000 mg | ORAL_TABLET | Freq: Two times a day (BID) | ORAL | 0 refills | Status: AC

## 2024-10-01 MED ORDER — METHOCARBAMOL 500 MG PO TABS: 500.0000 mg | ORAL_TABLET | Freq: Two times a day (BID) | ORAL | 0 refills | Status: AC
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
## 2024-10-01 NOTE — Discharge Instructions (Addendum)
I have prescribed muscle relaxers for your pain, please do not drink or drive while taking this medications as it can make you drowsy.     Please follow-up with PCP in 1 week for reevaluation of your symptoms.If you experience any bowel or bladder incontinence, fever, worsening in your symptoms please return to the ED.  

## 2024-10-06 ENCOUNTER — Telehealth: Payer: Self-pay | Admitting: Physician Assistant

## 2024-10-06 DIAGNOSIS — L03011 Cellulitis of right finger: Secondary | ICD-10-CM | POA: Diagnosis not present

## 2024-10-06 MED ORDER — DOXYCYCLINE HYCLATE 100 MG PO TABS: 100.0000 mg | ORAL_TABLET | Freq: Two times a day (BID) | ORAL | 0 refills | Status: AC

## 2024-10-06 NOTE — Patient Instructions (Signed)
 Donald Fisher, thank you for joining Teena Shuck, PA-C for today's virtual visit.  While this provider is not your primary care provider (PCP), if your PCP is located in our provider database this encounter information will be shared with them immediately following your visit.   A [facility] MyChart account gives you access to today's visit and all your visits, tests, and labs performed at [facility]  click here if you don't have a [facility] MyChart account or go to [email or web address]  Consent: (Patient) Donald Fisher provided verbal consent for this virtual visit at the beginning of the encounter.  Current Medications:  Current Outpatient Medications:    clindamycin  (CLEOCIN ) 300 MG capsule, Take 1 capsule (300 mg total) by mouth 3 (three) times daily. (Patient not taking: Reported on 09/13/2021), Disp: 30 capsule, Rfl: 0   HYDROcodone -acetaminophen  (NORCO) 5-325 MG tablet, Take 1-2 tablets by mouth every 6 (six) hours as needed for moderate pain., Disp: 20 tablet, Rfl: 0   ibuprofen  (ADVIL ) 600 MG tablet, Take 1 tablet (600 mg total) by mouth every 6 (six) hours as needed., Disp: 30 tablet, Rfl: 0   lidocaine  (LIDODERM ) 5 %, Place 1 patch onto the skin daily. Remove & Discard patch within 12 hours or as directed by MD, Disp: 30 patch, Rfl: 0   methocarbamol  (ROBAXIN ) 500 MG tablet, Take 1 tablet (500 mg total) by mouth 2 (two) times daily for 7 days., Disp: 14 tablet, Rfl: 0   naproxen  (NAPROSYN ) 375 MG tablet, Take 1 tablet (375 mg total) by mouth 2 (two) times daily for 7 days., Disp: 14 tablet, Rfl: 0   ondansetron  (ZOFRAN -ODT) 4 MG disintegrating tablet, Take 1 tablet (4 mg total) by mouth every 8 (eight) hours as needed for nausea or vomiting., Disp: 8 tablet, Rfl: 0   oxyCODONE -acetaminophen  (PERCOCET/ROXICET) 5-325 MG tablet, Take 1 tablet by mouth every 6 (six) hours as needed for severe pain. (Patient not taking: Reported on 09/13/2021), Disp: 12 tablet, Rfl:  0   predniSONE  (DELTASONE ) 5 MG tablet, Take 6 pills for first day, 5 pills second day, 4 pills third day, 3 pills fourth day, 2 pills the fifth day, and 1 pill sixth day. (Patient not taking: Reported on 05/01/2021), Disp: 21 tablet, Rfl: 0   tiZANidine  (ZANAFLEX ) 2 MG tablet, Take 1 tablet (2 mg total) by mouth every 8 (eight) hours as needed for muscle spasms., Disp: 21 tablet, Rfl: 0   Medications ordered in this encounter:  No orders of the defined types were placed in this encounter.    *If you need refills on other medications prior to your next appointment, please contact your pharmacy*  Follow-Up: Call back or seek an in-person evaluation if the symptoms worsen or if the condition fails to improve as anticipated.  [facility] [phone]  Other Instructions Follow up with primary provider in 24-48 hours. Report to nearest ER with any worsening symptoms.    If you have been instructed to have an in-person evaluation today at a local Urgent Care facility, please use the link below. It will take you to a list of all of our available [facility], including address, phone number and hours of operation. Please do not delay care.   Urgent Cares  If you or a family member do not have a primary care provider, use the link below to schedule a visit and establish care. When you choose a  primary care physician or advanced practice  provider, you gain a long-term partner in health. Find a Primary Care Provider  Learn more about [facility]'s in-office and virtual care options: [facility] - Get Care Now

## 2024-10-06 NOTE — Progress Notes (Signed)
 Virtual Visit Consent   Donald Fisher, you are scheduled for a virtual visit with a [facility] provider today. Just as with appointments in the office, your consent must be obtained to participate. Your consent will be active for this visit and any virtual visit you may have with one of our providers in the next 365 days. If you have a MyChart account, a copy of this consent can be sent to you electronically.  As this is a virtual visit, video technology does not allow for your provider to perform a traditional examination. This may limit your provider's ability to fully assess your condition. If your provider identifies any concerns that need to be evaluated in person or the need to arrange testing (such as labs, EKG, etc.), we will make arrangements to do so. Although advances in technology are sophisticated, we cannot ensure that it will always work on either your end or our end. If the connection with a video visit is poor, the visit may have to be switched to a telephone visit. With either a video or telephone visit, we are not always able to ensure that we have a secure connection.  By engaging in this virtual visit, you consent to the provision of healthcare and authorize for your insurance to be billed (if applicable) for the services provided during this visit. Depending on your insurance coverage, you may receive a charge related to this service.  I need to obtain your verbal consent now. Are you willing to proceed with your visit today? Donald Fisher has provided verbal consent on 10/06/2024 for a virtual visit (video or telephone). Teena Shuck, [place]  Date: 10/06/2024 9:06 AM   Virtual Visit via Video Note   I, Teena Shuck, connected with  Donald Fisher  ([id number], 10-05-1991) on 10/06/24 at  9:00 AM EST by a video-enabled telemedicine application and verified that I am speaking with the correct person using two identifiers.  Location: Patient: Virtual Visit Location Patient:  Home Provider: Virtual Visit Location Provider: Home Office   I discussed the limitations of evaluation and management by telemedicine and the availability of in person appointments. The patient expressed understanding and agreed to proceed.    History of Present Illness: Donald Fisher is a 33 y.o. who identifies as a male who was assigned male at birth, and is being seen today for right finger hangnail.  HPI: Hand Pain  The incident occurred 5 to 7 days ago. There was no injury mechanism. The pain is present in the right fingers. The quality of the pain is described as aching. The pain is mild. The pain has been Fluctuating since the incident. Pertinent negatives include no numbness or tingling. The symptoms are aggravated by movement. He has tried nothing for the symptoms.    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 Throat swells   Penicillins Hives    Has patient had a PCN reaction causing immediate rash, facial/tongue/throat swelling, SOB or lightheadedness with hypotension: Yes Has patient had a PCN reaction causing severe rash involving mucus membranes or skin necrosis: No Has  patient had a PCN reaction that required hospitalization: No Has patient had a PCN reaction occurring within the last 10 years: Yes If all of the above answers are NO, then may proceed with Cephalosporin use.    Betadine [Povidone Iodine] Rash and Other (See Comments)    Allergy since childhood   Medications:  Current Outpatient Medications:    clindamycin  (CLEOCIN ) 300 MG capsule, Take 1 capsule (300 mg total) by mouth 3  (three) times daily. (Patient not taking: Reported on 09/13/2021), Disp: 30 capsule, Rfl: 0   HYDROcodone -acetaminophen  (NORCO) 5-325 MG tablet, Take 1-2 tablets by mouth every 6 (six) hours as needed for moderate pain., Disp: 20 tablet, Rfl: 0   ibuprofen  (ADVIL ) 600 MG tablet, Take 1 tablet (600 mg total) by mouth every 6 (six) hours as needed., Disp: 30 tablet, Rfl: 0   lidocaine  (LIDODERM ) 5 %, Place 1 patch onto the skin daily. Remove & Discard patch within 12 hours or as directed by MD, Disp: 30 patch, Rfl: 0   methocarbamol  (ROBAXIN ) 500 MG tablet, Take 1 tablet (500 mg total) by mouth 2 (two) times daily for 7 days., Disp: 14 tablet, Rfl: 0   naproxen  (NAPROSYN ) 375 MG tablet, Take 1 tablet (375 mg total) by mouth 2 (two) times daily for 7 days., Disp: 14 tablet, Rfl: 0   ondansetron  (ZOFRAN -ODT) 4 MG disintegrating tablet, Take 1 tablet (4 mg total) by mouth every 8 (eight) hours as needed for nausea or vomiting., Disp: 8 tablet, Rfl: 0   oxyCODONE -acetaminophen  (PERCOCET/ROXICET) 5-325 MG tablet, Take 1 tablet by mouth every 6 (six) hours as needed for severe pain. (Patient not taking: Reported on 09/13/2021), Disp: 12 tablet, Rfl: 0   predniSONE  (DELTASONE ) 5 MG tablet, Take 6 pills for first day, 5 pills second day, 4 pills third day, 3 pills fourth day, 2 pills the fifth day, and 1 pill sixth day. (Patient not taking: Reported on 05/01/2021), Disp: 21 tablet, Rfl: 0   tiZANidine  (ZANAFLEX ) 2 MG tablet, Take 1 tablet (2 mg total) by mouth every 8 (eight) hours as needed for muscle spasms., Disp: 21 tablet, Rfl: 0  Observations/Objective: Patient is well-developed, well-nourished in no acute distress.  Resting comfortably  at home.  Head is normocephalic, atraumatic.  No labored breathing.  Speech is clear and coherent with logical content.  Patient is alert and oriented at baseline.  Mildly swollen medial nailfold at right middle finger,   Assessment and Plan: 1. Paronychia of right  middle finger (Primary)  Patient presenting with paronychia after removing hang nail. Does endorse drainage coming from the area. Advised to complete warm water soaks daily and keep the area covered at work. Also advised that if the area didn't improve with abx he would need to come in person for evaluation. Patient agreed to this plan and all questions answered.   Follow Up Instructions: I discussed the assessment and treatment plan with the patient. The patient was provided an opportunity to ask questions and all were answered. The patient agreed with the plan and demonstrated an understanding of the instructions.  A copy of instructions were sent to the patient via MyChart unless otherwise noted below.    The patient was advised to call back or seek an in-person evaluation if the symptoms worsen or if the condition fails to improve as anticipated.    Teena Shuck, PA-C
